# Patient Record
Sex: Female | Born: 1970 | Race: Black or African American | Hispanic: No | Marital: Single | Smoking: Current every day smoker
Health system: Southern US, Community
[De-identification: ages and names within clinical notes are randomized; demographics above are authoritative.]

## PROBLEM LIST (undated history)

## (undated) DIAGNOSIS — F419 Anxiety disorder, unspecified: Secondary | ICD-10-CM

## (undated) DIAGNOSIS — D849 Immunodeficiency, unspecified: Secondary | ICD-10-CM

## (undated) DIAGNOSIS — K219 Gastro-esophageal reflux disease without esophagitis: Secondary | ICD-10-CM

## (undated) DIAGNOSIS — N946 Dysmenorrhea, unspecified: Secondary | ICD-10-CM

## (undated) DIAGNOSIS — L409 Psoriasis, unspecified: Secondary | ICD-10-CM

## (undated) DIAGNOSIS — K2970 Gastritis, unspecified, without bleeding: Secondary | ICD-10-CM

## (undated) DIAGNOSIS — E785 Hyperlipidemia, unspecified: Secondary | ICD-10-CM

## (undated) DIAGNOSIS — D219 Benign neoplasm of connective and other soft tissue, unspecified: Secondary | ICD-10-CM

## (undated) DIAGNOSIS — R32 Unspecified urinary incontinence: Secondary | ICD-10-CM

## (undated) HISTORY — DX: Benign neoplasm of connective and other soft tissue, unspecified: D21.9

## (undated) HISTORY — DX: Hyperlipidemia, unspecified: E78.5

## (undated) HISTORY — DX: Immunodeficiency, unspecified: D84.9

## (undated) HISTORY — DX: Gastritis, unspecified, without bleeding: K29.70

## (undated) HISTORY — DX: Unspecified urinary incontinence: R32

## (undated) HISTORY — DX: Dysmenorrhea, unspecified: N94.6

## (undated) HISTORY — DX: Psoriasis, unspecified: L40.9

## (undated) HISTORY — PX: TUBAL LIGATION: SHX77

---

## 2003-12-10 ENCOUNTER — Emergency Department: Payer: Self-pay | Admitting: Emergency Medicine

## 2004-09-17 ENCOUNTER — Emergency Department: Payer: Self-pay | Admitting: General Practice

## 2005-05-04 ENCOUNTER — Emergency Department: Payer: Self-pay | Admitting: General Practice

## 2006-08-18 ENCOUNTER — Emergency Department: Payer: Self-pay | Admitting: General Practice

## 2006-10-04 ENCOUNTER — Emergency Department: Payer: Self-pay | Admitting: Emergency Medicine

## 2007-07-16 ENCOUNTER — Emergency Department: Payer: Self-pay | Admitting: Internal Medicine

## 2007-09-06 ENCOUNTER — Emergency Department: Payer: Self-pay | Admitting: Emergency Medicine

## 2008-08-23 ENCOUNTER — Emergency Department: Payer: Self-pay | Admitting: Emergency Medicine

## 2009-10-27 ENCOUNTER — Emergency Department: Payer: Self-pay | Admitting: Emergency Medicine

## 2009-10-29 ENCOUNTER — Emergency Department (HOSPITAL_COMMUNITY): Admission: EM | Admit: 2009-10-29 | Discharge: 2009-10-29 | Payer: Self-pay | Admitting: Emergency Medicine

## 2009-12-11 ENCOUNTER — Emergency Department: Payer: Self-pay | Admitting: Emergency Medicine

## 2010-02-19 ENCOUNTER — Emergency Department: Payer: Self-pay | Admitting: Emergency Medicine

## 2010-03-10 ENCOUNTER — Emergency Department (HOSPITAL_COMMUNITY)
Admission: EM | Admit: 2010-03-10 | Discharge: 2010-03-10 | Payer: Self-pay | Source: Home / Self Care | Admitting: Emergency Medicine

## 2010-04-11 ENCOUNTER — Emergency Department: Payer: Self-pay | Admitting: Emergency Medicine

## 2010-05-08 LAB — CBC
HCT: 41.4 % (ref 36.0–46.0)
Hemoglobin: 14 g/dL (ref 12.0–15.0)
RBC: 4.65 MIL/uL (ref 3.87–5.11)
WBC: 10.5 10*3/uL (ref 4.0–10.5)

## 2010-05-08 LAB — COMPREHENSIVE METABOLIC PANEL
ALT: 12 U/L (ref 0–35)
AST: 18 U/L (ref 0–37)
Alkaline Phosphatase: 75 U/L (ref 39–117)
CO2: 25 mEq/L (ref 19–32)
GFR calc non Af Amer: >60 mL/min (ref >60)
Glucose, Bld: 93 mg/dL (ref 70–99)
Potassium: 3.5 mEq/L (ref 3.5–5.1)
Sodium: 138 mEq/L (ref 135–145)

## 2010-05-08 LAB — URINALYSIS, ROUTINE W REFLEX MICROSCOPIC
Bilirubin Urine: NEGATIVE
Hgb urine dipstick: NEGATIVE
Ketones, ur: 15 mg/dL — AB
Protein, ur: NEGATIVE mg/dL
Urobilinogen, UA: 0.2 mg/dL (ref 0.0–1.0)

## 2010-05-08 LAB — LIPASE, BLOOD: Lipase: 17 U/L (ref 11–59)

## 2010-05-08 LAB — DIFFERENTIAL
Basophils Relative: 0 % (ref 0–1)
Eosinophils Absolute: 0 10*3/uL (ref 0.0–0.7)
Eosinophils Relative: 0 % (ref 0–5)
Monocytes Relative: 8 % (ref 3–12)
Neutrophils Relative %: 69 % (ref 43–77)

## 2010-05-08 LAB — POCT PREGNANCY, URINE: Preg Test, Ur: NEGATIVE

## 2010-05-08 LAB — URINE MICROSCOPIC-ADD ON

## 2010-09-09 ENCOUNTER — Emergency Department: Payer: Self-pay | Admitting: Emergency Medicine

## 2011-12-16 ENCOUNTER — Emergency Department: Payer: Self-pay | Admitting: Emergency Medicine

## 2011-12-16 LAB — URINALYSIS, COMPLETE
Bacteria: NONE SEEN
Bilirubin,UR: NEGATIVE
Glucose,UR: NEGATIVE mg/dL (ref 0–75)
Ketone: NEGATIVE
Specific Gravity: 1.001 (ref 1.003–1.030)
Squamous Epithelial: 6
WBC UR: 3 /HPF (ref 0–5)

## 2011-12-16 LAB — CBC
HGB: 14.7 g/dL (ref 12.0–16.0)
MCH: 31.3 pg (ref 26.0–34.0)
Platelet: 209 10*3/uL (ref 150–440)
RBC: 4.7 10*6/uL (ref 3.80–5.20)
WBC: 9.8 10*3/uL (ref 3.6–11.0)

## 2011-12-16 LAB — BASIC METABOLIC PANEL
Creatinine: 0.53 mg/dL — ABNORMAL LOW (ref 0.60–1.30)
EGFR (African American): >60
EGFR (Non-African Amer.): >60
Glucose: 84 mg/dL (ref 65–99)
Sodium: 140 mmol/L (ref 136–145)

## 2011-12-17 LAB — WET PREP, GENITAL

## 2012-02-15 ENCOUNTER — Emergency Department: Payer: Self-pay | Admitting: Emergency Medicine

## 2012-08-22 ENCOUNTER — Other Ambulatory Visit: Payer: Self-pay | Admitting: Orthopedic Surgery

## 2012-08-22 DIAGNOSIS — M79645 Pain in left finger(s): Secondary | ICD-10-CM

## 2012-08-22 DIAGNOSIS — M25532 Pain in left wrist: Secondary | ICD-10-CM

## 2012-08-28 ENCOUNTER — Ambulatory Visit
Admission: RE | Admit: 2012-08-28 | Discharge: 2012-08-28 | Disposition: A | Discharge status: Home / Self Care | Payer: BC Managed Care – PPO | Source: Ambulatory Visit | Attending: Orthopedic Surgery | Admitting: Orthopedic Surgery

## 2012-08-28 DIAGNOSIS — M79645 Pain in left finger(s): Secondary | ICD-10-CM

## 2012-08-28 DIAGNOSIS — M25532 Pain in left wrist: Secondary | ICD-10-CM

## 2013-04-24 ENCOUNTER — Emergency Department: Payer: Self-pay | Admitting: Emergency Medicine

## 2013-04-24 LAB — URINALYSIS, COMPLETE
Bilirubin,UR: NEGATIVE
GLUCOSE, UR: NEGATIVE mg/dL (ref 0–75)
KETONE: NEGATIVE
NITRITE: NEGATIVE
PROTEIN: NEGATIVE
Ph: 5 (ref 4.5–8.0)
RBC,UR: 15 /HPF (ref 0–5)
SPECIFIC GRAVITY: 1.011 (ref 1.003–1.030)

## 2013-04-24 LAB — WET PREP, GENITAL

## 2013-04-24 LAB — GC/CHLAMYDIA PROBE AMP

## 2014-01-02 ENCOUNTER — Emergency Department: Payer: Self-pay | Admitting: Emergency Medicine

## 2014-01-02 LAB — COMPREHENSIVE METABOLIC PANEL
ALT: 34 U/L
ANION GAP: 6 — AB (ref 7–16)
AST: 23 U/L (ref 15–37)
Albumin: 3.5 g/dL (ref 3.4–5.0)
Alkaline Phosphatase: 88 U/L
BUN: 11 mg/dL (ref 7–18)
Bilirubin,Total: 0.2 mg/dL (ref 0.2–1.0)
CALCIUM: 8.4 mg/dL — AB (ref 8.5–10.1)
CHLORIDE: 107 mmol/L (ref 98–107)
CREATININE: 0.57 mg/dL — AB (ref 0.60–1.30)
Co2: 28 mmol/L (ref 21–32)
EGFR (African American): >60
GLUCOSE: 88 mg/dL (ref 65–99)
OSMOLALITY: 280 (ref 275–301)
POTASSIUM: 4.2 mmol/L (ref 3.5–5.1)
SODIUM: 141 mmol/L (ref 136–145)
TOTAL PROTEIN: 6.8 g/dL (ref 6.4–8.2)

## 2014-01-02 LAB — URINALYSIS, COMPLETE
BACTERIA: NONE SEEN
BILIRUBIN, UR: NEGATIVE
Glucose,UR: NEGATIVE mg/dL (ref 0–75)
Ketone: NEGATIVE
Leukocyte Esterase: NEGATIVE
NITRITE: NEGATIVE
PH: 6 (ref 4.5–8.0)
Protein: NEGATIVE
SPECIFIC GRAVITY: 1.002 (ref 1.003–1.030)
WBC UR: NONE SEEN /HPF (ref 0–5)

## 2014-01-02 LAB — CBC
HCT: 40.1 % (ref 35.0–47.0)
HGB: 13.2 g/dL (ref 12.0–16.0)
MCH: 30.1 pg (ref 26.0–34.0)
MCHC: 33 g/dL (ref 32.0–36.0)
MCV: 91 fL (ref 80–100)
Platelet: 250 10*3/uL (ref 150–440)
RBC: 4.39 10*6/uL (ref 3.80–5.20)
RDW: 14.5 % (ref 11.5–14.5)
WBC: 10 10*3/uL (ref 3.6–11.0)

## 2014-06-02 ENCOUNTER — Emergency Department
Admit: 2014-06-02 | Disposition: A | Discharge status: Home / Self Care | Payer: Self-pay | Admitting: Physician Assistant

## 2015-03-09 ENCOUNTER — Encounter (HOSPITAL_BASED_OUTPATIENT_CLINIC_OR_DEPARTMENT_OTHER): Payer: Self-pay

## 2015-03-09 ENCOUNTER — Emergency Department (HOSPITAL_BASED_OUTPATIENT_CLINIC_OR_DEPARTMENT_OTHER)
Admission: EM | Admit: 2015-03-09 | Discharge: 2015-03-09 | Disposition: A | Discharge status: Home / Self Care | Payer: BLUE CROSS/BLUE SHIELD | Attending: Emergency Medicine | Admitting: Emergency Medicine

## 2015-03-09 DIAGNOSIS — Z3202 Encounter for pregnancy test, result negative: Secondary | ICD-10-CM | POA: Diagnosis not present

## 2015-03-09 DIAGNOSIS — F17210 Nicotine dependence, cigarettes, uncomplicated: Secondary | ICD-10-CM | POA: Diagnosis not present

## 2015-03-09 DIAGNOSIS — R1010 Upper abdominal pain, unspecified: Secondary | ICD-10-CM

## 2015-03-09 DIAGNOSIS — Z880 Allergy status to penicillin: Secondary | ICD-10-CM | POA: Insufficient documentation

## 2015-03-09 DIAGNOSIS — R197 Diarrhea, unspecified: Secondary | ICD-10-CM | POA: Diagnosis not present

## 2015-03-09 DIAGNOSIS — M542 Cervicalgia: Secondary | ICD-10-CM | POA: Insufficient documentation

## 2015-03-09 DIAGNOSIS — M549 Dorsalgia, unspecified: Secondary | ICD-10-CM | POA: Diagnosis present

## 2015-03-09 DIAGNOSIS — R1013 Epigastric pain: Secondary | ICD-10-CM | POA: Diagnosis not present

## 2015-03-09 DIAGNOSIS — R1011 Right upper quadrant pain: Secondary | ICD-10-CM | POA: Diagnosis not present

## 2015-03-09 DIAGNOSIS — Z8719 Personal history of other diseases of the digestive system: Secondary | ICD-10-CM | POA: Insufficient documentation

## 2015-03-09 DIAGNOSIS — R1012 Left upper quadrant pain: Secondary | ICD-10-CM | POA: Insufficient documentation

## 2015-03-09 HISTORY — DX: Gastro-esophageal reflux disease without esophagitis: K21.9

## 2015-03-09 LAB — COMPREHENSIVE METABOLIC PANEL
ALT: 12 U/L — ABNORMAL LOW (ref 14–54)
ANION GAP: 8 (ref 5–15)
AST: 17 U/L (ref 15–41)
Albumin: 3.8 g/dL (ref 3.5–5.0)
Alkaline Phosphatase: 52 U/L (ref 38–126)
BUN: 7 mg/dL (ref 6–20)
CHLORIDE: 106 mmol/L (ref 101–111)
CO2: 27 mmol/L (ref 22–32)
Calcium: 9.3 mg/dL (ref 8.9–10.3)
Creatinine, Ser: 0.6 mg/dL (ref 0.44–1.00)
GFR calc non Af Amer: >60 mL/min (ref >60)
Glucose, Bld: 89 mg/dL (ref 65–99)
POTASSIUM: 3.7 mmol/L (ref 3.5–5.1)
SODIUM: 141 mmol/L (ref 135–145)
Total Bilirubin: 0.5 mg/dL (ref 0.3–1.2)
Total Protein: 6.4 g/dL — ABNORMAL LOW (ref 6.5–8.1)

## 2015-03-09 LAB — CBC WITH DIFFERENTIAL/PLATELET
Basophils Absolute: 0 10*3/uL (ref 0.0–0.1)
Basophils Relative: 0 %
Eosinophils Absolute: 0.1 10*3/uL (ref 0.0–0.7)
Eosinophils Relative: 1 %
HEMATOCRIT: 37.9 % (ref 36.0–46.0)
Hemoglobin: 12.3 g/dL (ref 12.0–15.0)
LYMPHS ABS: 1.2 10*3/uL (ref 0.7–4.0)
LYMPHS PCT: 17 %
MCH: 28.8 pg (ref 26.0–34.0)
MCHC: 32.5 g/dL (ref 30.0–36.0)
MCV: 88.8 fL (ref 78.0–100.0)
Monocytes Absolute: 0.6 10*3/uL (ref 0.1–1.0)
Monocytes Relative: 9 %
NEUTROS PCT: 73 %
Neutro Abs: 5.1 10*3/uL (ref 1.7–7.7)
PLATELETS: 269 10*3/uL (ref 150–400)
RBC: 4.27 MIL/uL (ref 3.87–5.11)
RDW: 14.6 % (ref 11.5–15.5)
WBC: 7 10*3/uL (ref 4.0–10.5)

## 2015-03-09 LAB — LIPASE, BLOOD: Lipase: 17 U/L (ref 11–51)

## 2015-03-09 LAB — PREGNANCY, URINE: Preg Test, Ur: NEGATIVE

## 2015-03-09 MED ORDER — PANTOPRAZOLE SODIUM 40 MG PO TBEC: 40.0000 mg | DELAYED_RELEASE_TABLET | Freq: Every day | ORAL | Status: DC

## 2015-03-09 MED ORDER — GI COCKTAIL ~~LOC~~
30.0000 mL | Freq: Once | ORAL | Status: AC
  Administered 2015-03-09: 30 mL via ORAL
  Filled 2015-03-09: qty 30

## 2015-03-09 NOTE — ED Notes (Signed)
Patient here with multiple complaints of feeling full, not able to pass gas and reports constipation had small BM this am. Patient states that she thinks all the gas is moving around in her back

## 2015-03-09 NOTE — ED Provider Notes (Signed)
CSN: [id number]     Arrival date & time 03/09/15  [id number] History   First MD Initiated Contact with Patient 03/09/15 1025     Chief Complaint  Patient presents with  . Neck Pain  . Back Pain     (Consider location/radiation/quality/duration/timing/severity/associated sxs/prior Treatment) HPI  45 year old female presents with "my gastritis flaring". She states she's been having intermittent pain, mostly in her lateral neck and back for the past 1 week. She is burping frequently and when she burps the pain seems to get better but then it comes back. Denies abdominal pain or chest pain. No shortness of breath. Patient denies a weakness or numbness in her extremities. She states that she has a history of GERD and history on Pepcid and other medicines without any relief. Was told by her PCP about one week ago that all blood work she is a prediabetic and was told to cut out sugar and exercise more. She thinks changing her diet recently has caused a lot of the symptoms. She had diarrhea couple days ago but since has been having bowel movements that are more normal.   Past Medical History  Diagnosis Date  . GERD (gastroesophageal reflux disease)    History reviewed. No pertinent past surgical history. No family history on file. Social History  Substance Use Topics  . Smoking status: Current Every Day Smoker    Types: Cigarettes  . Smokeless tobacco: None  . Alcohol Use: None   OB History    No data available     Review of Systems  Respiratory: Negative for shortness of breath.   Cardiovascular: Negative for chest pain.  Gastrointestinal: Positive for diarrhea. Negative for vomiting and abdominal pain.  Genitourinary: Negative for dysuria.  Musculoskeletal: Positive for back pain and neck pain.  All other systems reviewed and are negative.     Allergies  Penicillins  Home Medications   Prior to Admission medications   Not on File   BP 124/83 mmHg  Pulse 80  Temp(Src) 98.8 F  (37.1 C) (Oral)  Resp 14  Ht 5' (1.524 m)  Wt 160 lb (72.576 kg)  BMI 31.25 kg/m2  SpO2 98% Physical Exam  Constitutional: She is oriented to person, place, and time. She appears well-developed and well-nourished.  HENT:  Head: Normocephalic and atraumatic.  Right Ear: External ear normal.  Left Ear: External ear normal.  Nose: Nose normal.  Eyes: Right eye exhibits no discharge. Left eye exhibits no discharge.  Cardiovascular: Normal rate, regular rhythm and normal heart sounds.   Pulmonary/Chest: Effort normal and breath sounds normal.  Abdominal: Soft. There is tenderness in the right upper quadrant, epigastric area and left upper quadrant.  Musculoskeletal:  No neck or back midline or soft tissue tenderness  Neurological: She is alert and oriented to person, place, and time.  Skin: Skin is warm and dry.  Nursing note and vitals reviewed.   ED Course  Procedures (including critical care time) Labs Review Labs Reviewed  COMPREHENSIVE METABOLIC PANEL - Abnormal; Notable for the following:    Total Protein 6.4 (*)    ALT 12 (*)    All other components within normal limits  LIPASE, BLOOD  CBC WITH DIFFERENTIAL/PLATELET  PREGNANCY, URINE    Imaging Review No results found. I have personally reviewed and evaluated these images and lab results as part of my medical decision-making.   EKG Interpretation None      MDM   Final diagnoses:  Upper abdominal pain  Patient with multiple moving pain complaints. She should persist to her past acid reflux. The only finding on exam is some mild upper abdominal tenderness. Labwork unremarkable and I have low suspicion for gallbladder or pancreas pathology. Most likely is GI in origin, will start on Protonix and follow-up with PCP.    Sherwood Gambler, MD 03/10/15 [phone]

## 2015-03-20 ENCOUNTER — Encounter (HOSPITAL_BASED_OUTPATIENT_CLINIC_OR_DEPARTMENT_OTHER): Payer: Self-pay | Admitting: Emergency Medicine

## 2015-03-20 ENCOUNTER — Emergency Department (HOSPITAL_BASED_OUTPATIENT_CLINIC_OR_DEPARTMENT_OTHER)
Admission: EM | Admit: 2015-03-20 | Discharge: 2015-03-20 | Disposition: A | Discharge status: Home / Self Care | Payer: BLUE CROSS/BLUE SHIELD | Attending: Emergency Medicine | Admitting: Emergency Medicine

## 2015-03-20 DIAGNOSIS — Z0000 Encounter for general adult medical examination without abnormal findings: Secondary | ICD-10-CM | POA: Diagnosis not present

## 2015-03-20 DIAGNOSIS — Y9389 Activity, other specified: Secondary | ICD-10-CM | POA: Insufficient documentation

## 2015-03-20 DIAGNOSIS — Z880 Allergy status to penicillin: Secondary | ICD-10-CM | POA: Insufficient documentation

## 2015-03-20 DIAGNOSIS — K219 Gastro-esophageal reflux disease without esophagitis: Secondary | ICD-10-CM | POA: Diagnosis not present

## 2015-03-20 DIAGNOSIS — F17210 Nicotine dependence, cigarettes, uncomplicated: Secondary | ICD-10-CM | POA: Diagnosis not present

## 2015-03-20 DIAGNOSIS — Z79899 Other long term (current) drug therapy: Secondary | ICD-10-CM | POA: Insufficient documentation

## 2015-03-20 DIAGNOSIS — Z23 Encounter for immunization: Secondary | ICD-10-CM | POA: Diagnosis not present

## 2015-03-20 DIAGNOSIS — Y998 Other external cause status: Secondary | ICD-10-CM | POA: Insufficient documentation

## 2015-03-20 DIAGNOSIS — W25XXXA Contact with sharp glass, initial encounter: Secondary | ICD-10-CM | POA: Diagnosis not present

## 2015-03-20 DIAGNOSIS — Y9289 Other specified places as the place of occurrence of the external cause: Secondary | ICD-10-CM | POA: Diagnosis not present

## 2015-03-20 DIAGNOSIS — S01512A Laceration without foreign body of oral cavity, initial encounter: Secondary | ICD-10-CM | POA: Diagnosis present

## 2015-03-20 MED ORDER — TETANUS-DIPHTH-ACELL PERTUSSIS 5-2.5-18.5 LF-MCG/0.5 IM SUSP
0.5000 mL | Freq: Once | INTRAMUSCULAR | Status: AC
  Administered 2015-03-20: 0.5 mL via INTRAMUSCULAR
  Filled 2015-03-20: qty 0.5

## 2015-03-20 NOTE — ED Notes (Signed)
Pt sts she bit into a biscuit at lunch and there was a piece of glass in the biscuit.  Cut her tongue.  No active bleeding at this time.  Sts she is unsure of her last tetanus.

## 2015-03-20 NOTE — ED Provider Notes (Signed)
CSN: [id number]     Arrival date & time 03/20/15  1741 History   First MD Initiated Contact with Patient 03/20/15 1934     Chief Complaint  Patient presents with  . tongue laceration      (Consider location/radiation/quality/duration/timing/severity/associated sxs/prior Treatment) HPI  Blood pressure 128/76, pulse 85, temperature 98.9 F (37.2 C), temperature source Oral, resp. rate 16, height 5' (1.524 m), weight 72.576 kg, last menstrual period 02/20/2015, SpO2 100 %.  Ruth Kennedy is a 45 y.o. female complaining of laceration to right side of tongue which she sustained this afternoon. Patient states that she was eating leftovers from a restaurant which she went to last night and she felt like there was a piece of glass in the biscuit. States that she could taste blood initially. She was encouraged to present to the ED by her primary care physician. She denies any pain in the throat, cough, nausea, vomiting. Last tetanus shot is unknown.  Past Medical History  Diagnosis Date  . GERD (gastroesophageal reflux disease)    Past Surgical History  Procedure Laterality Date  . Tubal ligation     No family history on file. Social History  Substance Use Topics  . Smoking status: Current Every Day Smoker -- 0.50 packs/day    Types: Cigarettes  . Smokeless tobacco: None  . Alcohol Use: Yes     Comment: weekly   OB History    No data available     Review of Systems  10 systems reviewed and found to be negative, except as noted in the HPI.   Allergies  Protonix and Penicillins  Home Medications   Prior to Admission medications   Medication Sig Start Date End Date Taking? Authorizing Provider  famotidine (PEPCID AC) 10 MG chewable tablet Chew 10 mg by mouth 2 (two) times daily.   Yes Historical Provider, MD  simethicone (MYLICON) 0000000 MG chewable tablet Chew 125 mg by mouth every 6 (six) hours as needed for flatulence.   Yes Historical Provider, MD  pantoprazole (PROTONIX)  40 MG tablet Take 1 tablet (40 mg total) by mouth daily. 03/09/15   Sherwood Gambler, MD   BP 128/76 mmHg  Pulse 85  Temp(Src) 98.9 F (37.2 C) (Oral)  Resp 16  Ht 5' (1.524 m)  Wt 72.576 kg  BMI 31.25 kg/m2  SpO2 100%  LMP 02/20/2015 (Approximate) Physical Exam  Constitutional: She is oriented to person, place, and time. She appears well-developed and well-nourished. No distress.  HENT:  Head: Normocephalic and atraumatic.  Mouth/Throat: Oropharynx is clear and moist.  Patient points to the far lateral right side of her tongue where she states the laceration occurred. States she can still feel it however, no observable bleeding or punctures.  Eyes: Conjunctivae and EOM are normal. Pupils are equal, round, and reactive to light.  Neck: Normal range of motion.  Cardiovascular: Normal rate, regular rhythm and intact distal pulses.   Pulmonary/Chest: Effort normal and breath sounds normal. No stridor.  Abdominal: Soft. There is no tenderness.  Musculoskeletal: Normal range of motion.  Neurological: She is alert and oriented to person, place, and time.  Skin: She is not diaphoretic.  Psychiatric: She has a normal mood and affect.  Nursing note and vitals reviewed.   ED Course  Procedures (including critical care time) Labs Review Labs Reviewed - No data to display  Imaging Review No results found. I have personally reviewed and evaluated these images and lab results as part of my medical decision-making.  EKG Interpretation None      MDM   Final diagnoses:  Normal physical exam    Filed Vitals:   03/20/15 1754  BP: 128/76  Pulse: 85  Temp: 98.9 F (37.2 C)  TempSrc: Oral  Resp: 16  Height: 5' (1.524 m)  Weight: 72.576 kg  SpO2: 100%    Medications  Tdap (BOOSTRIX) injection 0.5 mL (not administered)    Ruth Kennedy is 45 y.o. female presenting with sensation of tongue laceration after she was eating earlier in the day. Physical exam with no  abnormalities, she may have had a small cut to the tongue which has healed over. Patient's tetanus is updated  Evaluation does not show pathology that would require ongoing emergent intervention or inpatient treatment. Pt is hemodynamically stable and mentating appropriately. Discussed findings and plan with patient/guardian, who agrees with care plan. All questions answered. Return precautions discussed and outpatient follow up given.     Monico Blitz, PA-C 03/20/15 [place], DO 03/20/15 2358

## 2015-03-20 NOTE — Discharge Instructions (Signed)
Please follow with your primary care doctor in the next 2 days for a check-up. They must obtain records for further management.   Do not hesitate to return to the Emergency Department for any new, worsening or concerning symptoms.    Mouth Laceration A mouth laceration is a deep cut inside your mouth. The cut may go into your lip or go all of the way through your mouth and cheek. The cut may involve your tongue, the insides of your check, or the upper surface of your mouth (palate). Mouth lacerations may bleed a lot and may need to be treated with stitches (sutures). HOME CARE  Take medicines only as told by your doctor.  If you were prescribed an antibiotic medicine, finish all of it even if you start to feel better.  Eat as told by your doctor. You may only be able to eat drink liquids or eat soft foods for a few days.  Rinse your mouth with a warm, salt-water rinse 4-6 times per day or as told by your doctor. You can make a salt-water rinse by mixing one tsp of salt into two cups of warm water.  Do not poke the sutures with your tongue. Doing that can loosen them.  Check your wound every day for signs of infection. It is normal to have a white or gray patch over your wound while it heals. Watch for:  Redness.  Puffiness (swelling).  Blood or pus.  Keep your mouth and teeth clean (oral hygiene) like you normally do, if possible. Gently brush your teeth with a soft, nylon-bristled toothbrush 2 times per day.  Keep all follow-up visits as told by your doctor. This is important. GET HELP IF:  You got a tetanus shot and you have swelling, really bad pain, redness, or bleeding at the injection site.  You have a fever.  Medicine does not help your pain.  You have redness, swelling, or pain at your wound that is getting worse.  You have fresh bleeding or pus coming from your wound.  The edges of your wound break open.  Your neck or throat is puffy or tender. GET HELP RIGHT  AWAY IF:  You have swelling in your face or the area under your jaw.  You have trouble breathing or swallowing.   This information is not intended to replace advice given to you by your health care provider. Make sure you discuss any questions you have with your health care provider.   Document Released: 07/29/2007 Document Revised: 06/26/2014 Document Reviewed: 01/31/2014 Elsevier Interactive Patient Education [employer].

## 2016-03-16 ENCOUNTER — Emergency Department (HOSPITAL_BASED_OUTPATIENT_CLINIC_OR_DEPARTMENT_OTHER)
Admission: EM | Admit: 2016-03-16 | Discharge: 2016-03-16 | Disposition: A | Discharge status: Home / Self Care | Payer: BLUE CROSS/BLUE SHIELD | Attending: Emergency Medicine | Admitting: Emergency Medicine

## 2016-03-16 ENCOUNTER — Encounter (HOSPITAL_BASED_OUTPATIENT_CLINIC_OR_DEPARTMENT_OTHER): Payer: Self-pay

## 2016-03-16 DIAGNOSIS — Z79899 Other long term (current) drug therapy: Secondary | ICD-10-CM | POA: Insufficient documentation

## 2016-03-16 DIAGNOSIS — N390 Urinary tract infection, site not specified: Secondary | ICD-10-CM | POA: Diagnosis not present

## 2016-03-16 DIAGNOSIS — F17210 Nicotine dependence, cigarettes, uncomplicated: Secondary | ICD-10-CM | POA: Insufficient documentation

## 2016-03-16 DIAGNOSIS — M545 Low back pain: Secondary | ICD-10-CM | POA: Diagnosis present

## 2016-03-16 LAB — WET PREP, GENITAL
Sperm: NONE SEEN
TRICH WET PREP: NONE SEEN
Yeast Wet Prep HPF POC: NONE SEEN

## 2016-03-16 LAB — URINALYSIS, ROUTINE W REFLEX MICROSCOPIC
Bilirubin Urine: NEGATIVE
GLUCOSE, UA: NEGATIVE mg/dL
Ketones, ur: NEGATIVE mg/dL
NITRITE: NEGATIVE
PH: 6 (ref 5.0–8.0)
PROTEIN: NEGATIVE mg/dL
Specific Gravity, Urine: 1.01 (ref 1.005–1.030)

## 2016-03-16 LAB — URINALYSIS, MICROSCOPIC (REFLEX)

## 2016-03-16 LAB — PREGNANCY, URINE: PREG TEST UR: NEGATIVE

## 2016-03-16 MED ORDER — DOXYCYCLINE HYCLATE 100 MG PO CAPS: 100.0000 mg | ORAL_CAPSULE | Freq: Two times a day (BID) | ORAL | 0 refills | Status: DC

## 2016-03-16 MED ORDER — CEFTRIAXONE SODIUM 250 MG IJ SOLR
250.0000 mg | Freq: Once | INTRAMUSCULAR | Status: AC
  Administered 2016-03-16: 250 mg via INTRAMUSCULAR
  Filled 2016-03-16: qty 250

## 2016-03-16 MED ORDER — DOXYCYCLINE HYCLATE 100 MG PO TABS
100.0000 mg | ORAL_TABLET | Freq: Once | ORAL | Status: AC
  Administered 2016-03-16: 100 mg via ORAL
  Filled 2016-03-16: qty 1

## 2016-03-16 NOTE — ED Provider Notes (Signed)
[facility] Provider Note   CSN: [id number] Arrival date & time: 03/16/16  1703  By signing my name below, I, Ephriam Jenkins, attest that this documentation has been prepared under the direction and in the presence of Blanchie Dessert, MD. Electronically signed, Ephriam Jenkins, ED Scribe. 03/16/16. 8:28 PM.  History   Chief Complaint Chief Complaint  Patient presents with  . Abdominal Pain    HPI HPI Comments: Ruth Kennedy is a 46 y.o. female, with Hx of tubal ligation, who presents to the Emergency Department complaining of bilateral lower back pain that radiates circumferentially to her suprapubic abdomen, onset 3 days ago. She also reports increased urinary frequency and vaginal discharge. Her LNMP was at the end of last month. She is currently sexually active with one partner, he is not experiencing similar symptoms as far as she knows. Pt states "it feels like someone is kicking me in my back". She also notes some mild constipation that has persisted since the onset of her symptoms. No known injury. She works a Designer, multimedia. No nausea, vomiting or diarrhea.  The history is provided by the patient. No language interpreter was used.    Past Medical History:  Diagnosis Date  . GERD (gastroesophageal reflux disease)     There are no active problems to display for this patient.   Past Surgical History:  Procedure Laterality Date  . TUBAL LIGATION      OB History    No data available     Home Medications    Prior to Admission medications   Medication Sig Start Date End Date Taking? Authorizing Provider  Escitalopram Oxalate (LEXAPRO PO) Take by mouth.   Yes Historical Provider, MD  famotidine (PEPCID AC) 10 MG chewable tablet Chew 10 mg by mouth 2 (two) times daily.   Yes Historical Provider, MD  METHOCARBAMOL PO Take by mouth.   Yes Historical Provider, MD   Family History No family history on file.  Social History Social History  Substance Use Topics  .  Smoking status: Current Every Day Smoker    Packs/day: 0.50    Types: Cigarettes  . Smokeless tobacco: Never Used  . Alcohol use Yes   Allergies   Protonix [pantoprazole sodium] and Penicillins   Review of Systems Review of Systems  Gastrointestinal: Positive for abdominal pain (lower). Negative for diarrhea, nausea and vomiting.  Genitourinary: Positive for urgency.  Musculoskeletal: Positive for back pain (bilateral, lower).  All other systems reviewed and are negative.   Physical Exam Updated Vital Signs BP 131/74 (BP Location: Right Arm)   Pulse 85   Temp 98.6 F (37 C) (Oral)   Resp 18   Ht 5' (1.524 m)   Wt 176 lb (79.8 kg)   LMP 02/18/2016   SpO2 100%   BMI 34.37 kg/m   Physical Exam  Constitutional: She is oriented to person, place, and time. She appears well-developed and well-nourished.  HENT:  Head: Normocephalic and atraumatic.  Eyes: Conjunctivae are normal.  Neck: Neck supple.  Cardiovascular: Normal rate and regular rhythm.   Pulmonary/Chest: Effort normal and breath sounds normal.  Abdominal: Soft. Bowel sounds are normal. There is tenderness.  Mild suprapubic tenderness.   Genitourinary:  Genitourinary Comments: Mild cervical motion tenderness. Small amount of thin yellow discharge.  Musculoskeletal: Normal range of motion.  Mild lumbar tenderness. No flank tenderness.    Neurological: She is alert and oriented to person, place, and time.  Skin: Skin is warm and dry.  Psychiatric:  She has a normal mood and affect. Her behavior is normal.  Nursing note and vitals reviewed.   ED Treatments / Results  DIAGNOSTIC STUDIES: Oxygen Saturation is 100% on RA, normal by my interpretation.  COORDINATION OF CARE: 8:25 PM-Discussed treatment plan with pt at bedside and pt agreed to plan.   Labs (all labs ordered are listed, but only abnormal results are displayed) Labs Reviewed  WET PREP, GENITAL - Abnormal; Notable for the following:       Result  Value   Clue Cells Wet Prep HPF POC PRESENT (*)    WBC, Wet Prep HPF POC MANY (*)    All other components within normal limits  URINALYSIS, ROUTINE W REFLEX MICROSCOPIC - Abnormal; Notable for the following:    APPearance CLOUDY (*)    Hgb urine dipstick TRACE (*)    Leukocytes, UA LARGE (*)    All other components within normal limits  URINALYSIS, MICROSCOPIC (REFLEX) - Abnormal; Notable for the following:    Bacteria, UA FEW (*)    Squamous Epithelial / LPF 6-30 (*)    All other components within normal limits  PREGNANCY, URINE  GC/CHLAMYDIA PROBE AMP ([place]) NOT AT [facility]    EKG  EKG Interpretation None       Radiology No results found.  Procedures Procedures (including critical care time)  Medications Ordered in ED Medications  cefTRIAXone (ROCEPHIN) injection 250 mg (250 mg Intramuscular Given 03/16/16 2146)  doxycycline (VIBRA-TABS) tablet 100 mg (100 mg Oral Given 03/16/16 2146)     Initial Impression / Assessment and Plan / ED Course  I have reviewed the triage vital signs and the nursing notes.  Pertinent labs & imaging results that were available during my care of the patient were reviewed by me and considered in my medical decision making (see chart for details).    Patient is a 46 year old female presenting today with one week of worsening suprapubic and lumbar pain. She has had some mild vaginal discharge which is new but denies any new partners. Patient has had urinary frequency but denies any dysuria. On exam patient has suprapubic tenderness. Minimal vaginal discharge and minimal cervical motion tenderness but no chandelier sign and no localized ovarian tenderness concerning for torsion or tubo-ovarian abscess. Low suspicion for kidney stone. UA with large leukocytes and 6-30 white blood cells. However some contamination. Wet prep with many white blood cells and clue cells present. Will treat with Rocephin and doxycycline and cover for a UTI as well as  pelvic pathology. Patient given return precautions.  Final Clinical Impressions(s) / ED Diagnoses   Final diagnoses:  Urinary tract infection without hematuria, site unspecified    New Prescriptions Discharge Medication List as of 03/16/2016 10:07 PM    START taking these medications   Details  doxycycline (VIBRAMYCIN) 100 MG capsule Take 1 capsule (100 mg total) by mouth 2 (two) times daily., Starting Mon 03/16/2016, Print       I personally performed the services described in this documentation, which was scribed in my presence.  The recorded information has been reviewed and considered.     Blanchie Dessert, MD 03/16/16 [phone]

## 2016-03-16 NOTE — ED Triage Notes (Addendum)
C/o lower abd/back, left flank pain x 3 days-denies n/v/d-NAD-steady gait

## 2016-03-16 NOTE — ED Notes (Signed)
Pt verbalizes understanding of d/c instructions and denies any further needs at this time. 

## 2016-03-17 LAB — GC/CHLAMYDIA PROBE AMP (~~LOC~~) NOT AT ARMC
Chlamydia: NEGATIVE
Neisseria Gonorrhea: NEGATIVE

## 2016-08-09 ENCOUNTER — Emergency Department (HOSPITAL_BASED_OUTPATIENT_CLINIC_OR_DEPARTMENT_OTHER)
Admission: EM | Admit: 2016-08-09 | Discharge: 2016-08-09 | Disposition: A | Discharge status: Home / Self Care | Payer: BLUE CROSS/BLUE SHIELD | Attending: Emergency Medicine | Admitting: Emergency Medicine

## 2016-08-09 ENCOUNTER — Encounter (HOSPITAL_BASED_OUTPATIENT_CLINIC_OR_DEPARTMENT_OTHER): Payer: Self-pay | Admitting: Emergency Medicine

## 2016-08-09 ENCOUNTER — Emergency Department (HOSPITAL_BASED_OUTPATIENT_CLINIC_OR_DEPARTMENT_OTHER): Payer: BLUE CROSS/BLUE SHIELD

## 2016-08-09 DIAGNOSIS — R1030 Lower abdominal pain, unspecified: Secondary | ICD-10-CM | POA: Diagnosis present

## 2016-08-09 DIAGNOSIS — N730 Acute parametritis and pelvic cellulitis: Secondary | ICD-10-CM

## 2016-08-09 DIAGNOSIS — N739 Female pelvic inflammatory disease, unspecified: Secondary | ICD-10-CM | POA: Diagnosis not present

## 2016-08-09 DIAGNOSIS — F17210 Nicotine dependence, cigarettes, uncomplicated: Secondary | ICD-10-CM | POA: Insufficient documentation

## 2016-08-09 DIAGNOSIS — Z79811 Long term (current) use of aromatase inhibitors: Secondary | ICD-10-CM | POA: Insufficient documentation

## 2016-08-09 DIAGNOSIS — B9689 Other specified bacterial agents as the cause of diseases classified elsewhere: Secondary | ICD-10-CM

## 2016-08-09 DIAGNOSIS — N760 Acute vaginitis: Secondary | ICD-10-CM | POA: Insufficient documentation

## 2016-08-09 HISTORY — DX: Anxiety disorder, unspecified: F41.9

## 2016-08-09 LAB — URINALYSIS, ROUTINE W REFLEX MICROSCOPIC
Bilirubin Urine: NEGATIVE
GLUCOSE, UA: NEGATIVE mg/dL
Hgb urine dipstick: NEGATIVE
KETONES UR: NEGATIVE mg/dL
NITRITE: NEGATIVE
PH: 6.5 (ref 5.0–8.0)
Protein, ur: NEGATIVE mg/dL
Specific Gravity, Urine: 1.015 (ref 1.005–1.030)

## 2016-08-09 LAB — COMPREHENSIVE METABOLIC PANEL
ALBUMIN: 3.6 g/dL (ref 3.5–5.0)
ALK PHOS: 68 U/L (ref 38–126)
ALT: 15 U/L (ref 14–54)
AST: 17 U/L (ref 15–41)
Anion gap: 8 (ref 5–15)
BILIRUBIN TOTAL: 0.5 mg/dL (ref 0.3–1.2)
BUN: 6 mg/dL (ref 6–20)
CALCIUM: 8.5 mg/dL — AB (ref 8.9–10.3)
CO2: 25 mmol/L (ref 22–32)
CREATININE: 0.52 mg/dL (ref 0.44–1.00)
Chloride: 105 mmol/L (ref 101–111)
GFR calc Af Amer: >60 mL/min (ref >60)
GFR calc non Af Amer: >60 mL/min (ref >60)
GLUCOSE: 94 mg/dL (ref 65–99)
Potassium: 3.8 mmol/L (ref 3.5–5.1)
SODIUM: 138 mmol/L (ref 135–145)
TOTAL PROTEIN: 6.4 g/dL — AB (ref 6.5–8.1)

## 2016-08-09 LAB — CBC WITH DIFFERENTIAL/PLATELET
BASOS PCT: 1 %
Basophils Absolute: 0 10*3/uL (ref 0.0–0.1)
EOS ABS: 0.1 10*3/uL (ref 0.0–0.7)
Eosinophils Relative: 1 %
HEMATOCRIT: 36.3 % (ref 36.0–46.0)
HEMOGLOBIN: 12.1 g/dL (ref 12.0–15.0)
LYMPHS ABS: 1.9 10*3/uL (ref 0.7–4.0)
Lymphocytes Relative: 23 %
MCH: 28.5 pg (ref 26.0–34.0)
MCHC: 33.3 g/dL (ref 30.0–36.0)
MCV: 85.6 fL (ref 78.0–100.0)
MONOS PCT: 9 %
Monocytes Absolute: 0.8 10*3/uL (ref 0.1–1.0)
NEUTROS ABS: 5.6 10*3/uL (ref 1.7–7.7)
NEUTROS PCT: 66 %
Platelets: 283 10*3/uL (ref 150–400)
RBC: 4.24 MIL/uL (ref 3.87–5.11)
RDW: 16.2 % — ABNORMAL HIGH (ref 11.5–15.5)
WBC: 8.4 10*3/uL (ref 4.0–10.5)

## 2016-08-09 LAB — WET PREP, GENITAL
SPERM: NONE SEEN
Trich, Wet Prep: NONE SEEN
Yeast Wet Prep HPF POC: NONE SEEN

## 2016-08-09 LAB — LIPASE, BLOOD: Lipase: 17 U/L (ref 11–51)

## 2016-08-09 LAB — URINALYSIS, MICROSCOPIC (REFLEX)

## 2016-08-09 LAB — PREGNANCY, URINE: Preg Test, Ur: NEGATIVE

## 2016-08-09 MED ORDER — DOXYCYCLINE HYCLATE 100 MG PO TABS
100.0000 mg | ORAL_TABLET | Freq: Once | ORAL | Status: AC
  Administered 2016-08-09: 100 mg via ORAL
  Filled 2016-08-09: qty 1

## 2016-08-09 MED ORDER — CEFTRIAXONE SODIUM 250 MG IJ SOLR
250.0000 mg | Freq: Once | INTRAMUSCULAR | Status: AC
  Administered 2016-08-09: 250 mg via INTRAMUSCULAR
  Filled 2016-08-09: qty 250

## 2016-08-09 MED ORDER — IBUPROFEN 600 MG PO TABS: 600.0000 mg | ORAL_TABLET | Freq: Four times a day (QID) | ORAL | 0 refills | Status: DC | PRN

## 2016-08-09 MED ORDER — DOXYCYCLINE HYCLATE 100 MG PO CAPS: 100.0000 mg | ORAL_CAPSULE | Freq: Two times a day (BID) | ORAL | 0 refills | Status: AC

## 2016-08-09 MED ORDER — METRONIDAZOLE 500 MG PO TABS: 500.0000 mg | ORAL_TABLET | Freq: Two times a day (BID) | ORAL | 0 refills | Status: DC

## 2016-08-09 MED ORDER — KETOROLAC TROMETHAMINE 30 MG/ML IJ SOLN
30.0000 mg | Freq: Once | INTRAMUSCULAR | Status: AC
  Administered 2016-08-09: 30 mg via INTRAVENOUS
  Filled 2016-08-09: qty 1

## 2016-08-09 NOTE — ED Notes (Signed)
ED Provider at bedside. 

## 2016-08-09 NOTE — Discharge Instructions (Signed)
You have been seen today for pain in the lower abdomen. There are signs of inflammation on the pelvic exam. There are many possible reasons for this. You are being treated for these.  You also have signs of bacterial vaginosis on your wet prep.  Please take all of your antibiotics until finished!   You may develop abdominal discomfort or diarrhea from the antibiotic.  You may help offset this with probiotics which you can buy or get in yogurt. Do not eat or take the probiotics until 2 hours after your antibiotic.   You have been tested for STDs. Some of these results are still pending. Any abnormalities will be called to you. You have been prophylactically treated for gonorrhea and Chlamydia. This does not mean you necessarily have these diseases, treatment is precautionary. Be sure to follow safe sex practices, including monogamy and/or condom use.  Follow-up with your primary care provider or OB/GYN on this matter.

## 2016-08-09 NOTE — ED Provider Notes (Signed)
[facility] Provider Note   CSN: [id number] Arrival date & time: 08/09/16  0831     History   Chief Complaint Chief Complaint  Patient presents with  . Flank Pain    HPI Ruth Kennedy is a 46 y.o. female.  HPI   Ruth Kennedy is a 46 y.o. female, with a history of anxiety and GERD, presenting to the ED with lower abdominal pain beginning about three weeks ago. Waxing and waning, 6/10, feels like an intense pressure. Began to have lower back pain "near the butt" two days ago that feels like a dull ache. She has not experienced either of these issues before. She has tried ibuprofen intermittently without complete relief. Endorses some dysuria, abdominal distention, and abnormally increased vaginal discharge. Last BM this morning and normal.   Denies fever/chills, N/V/D, vaginal bleeding, hematochezia/melena, or any other complaints.   LMP "in April sometime." Started to get irregular about 6 months ago. Sexually active with one female partner.    Past Medical History:  Diagnosis Date  . Anxiety   . GERD (gastroesophageal reflux disease)     There are no active problems to display for this patient.   Past Surgical History:  Procedure Laterality Date  . TUBAL LIGATION      OB History    No data available       Home Medications    Prior to Admission medications   Medication Sig Start Date End Date Taking? Authorizing Provider  Escitalopram Oxalate (LEXAPRO PO) Take 10 mg by mouth every morning.    Yes [provider]  famotidine (PEPCID AC) 10 MG chewable tablet Chew 10 mg by mouth 2 (two) times daily.   Yes [provider]  METHOCARBAMOL PO Take 500 mg by mouth as needed.    Yes [provider]  doxycycline (VIBRAMYCIN) 100 MG capsule Take 1 capsule (100 mg total) by mouth 2 (two) times daily. 08/09/16 08/23/16  Shamekia Tippets C, PA-C  ibuprofen (ADVIL,MOTRIN) 600 MG tablet Take 1 tablet (600 mg total) by mouth every 6 (six)  hours as needed. 08/09/16   Jayr Lupercio C, PA-C  metroNIDAZOLE (FLAGYL) 500 MG tablet Take 1 tablet (500 mg total) by mouth 2 (two) times daily. 08/09/16   Author [place], Helane Gunther, PA-C    Family History No family history on file.  Social History Social History  Substance Use Topics  . Smoking status: Current Every Day Smoker    Packs/day: 1.00    Types: Cigarettes  . Smokeless tobacco: Never Used  . Alcohol use Yes     Comment: social     Allergies   Protonix [pantoprazole sodium] and Penicillins   Review of Systems Review of Systems  Constitutional: Positive for fatigue. Negative for chills, diaphoresis and fever.  Respiratory: Negative for shortness of breath.   Cardiovascular: Negative for chest pain.  Gastrointestinal: Positive for abdominal distention and abdominal pain. Negative for blood in stool, constipation, diarrhea, nausea and vomiting.  Genitourinary: Positive for dysuria and vaginal discharge.  Musculoskeletal: Positive for back pain.  All other systems reviewed and are negative.    Physical Exam Updated Vital Signs BP 125/79 (BP Location: Right Arm)   Pulse 83   Temp 98.6 F (37 C) (Oral)   Resp 18   Ht 5' (1.524 m)   Wt 79.4 kg (175 lb)   LMP 06/22/2016   SpO2 100%   BMI 34.18 kg/m   Physical Exam  Constitutional: She appears well-developed and well-nourished.  No distress.  HENT:  Head: Normocephalic and atraumatic.  Eyes: Conjunctivae are normal.  Neck: Neck supple.  Cardiovascular: Normal rate, regular rhythm, normal heart sounds and intact distal pulses.   Pulmonary/Chest: Effort normal and breath sounds normal. No respiratory distress.  Abdominal: Soft. There is tenderness in the left lower quadrant. There is no guarding and no CVA tenderness.  Genitourinary:  Genitourinary Comments: External genitalia normal Vagina with discharge - moderate, whitish-green discharge in vaginal vault Cervix  abnormal  - bright red, appears irritated, consistent with  cervicitis positive for cervical motion tenderness Adnexa palpated, no masses, positive for tenderness noted, especially on the left Bladder palpated negative for tenderness Uterus palpated no masses, negative for tenderness  Otherwise normal female genitalia. Med Tech served as chaperone during exam.  Musculoskeletal: She exhibits tenderness. She exhibits no edema.  Minor tenderness near the left sacral region.  Lymphadenopathy:    She has no cervical adenopathy.       Right: No inguinal adenopathy present.       Left: No inguinal adenopathy present.  Neurological: She is alert.  Skin: Skin is warm and dry. She is not diaphoretic.  Psychiatric: She has a normal mood and affect. Her behavior is normal.  Nursing note and vitals reviewed.    ED Treatments / Results  Labs (all labs ordered are listed, but only abnormal results are displayed) Labs Reviewed  WET PREP, GENITAL - Abnormal; Notable for the following:       Result Value   Clue Cells Wet Prep HPF POC PRESENT (*)    WBC, Wet Prep HPF POC MANY (*)    All other components within normal limits  URINALYSIS, ROUTINE W REFLEX MICROSCOPIC - Abnormal; Notable for the following:    Leukocytes, UA TRACE (*)    All other components within normal limits  CBC WITH DIFFERENTIAL/PLATELET - Abnormal; Notable for the following:    RDW 16.2 (*)    All other components within normal limits  COMPREHENSIVE METABOLIC PANEL - Abnormal; Notable for the following:    Calcium 8.5 (*)    Total Protein 6.4 (*)    All other components within normal limits  URINALYSIS, MICROSCOPIC (REFLEX) - Abnormal; Notable for the following:    Bacteria, UA MANY (*)    Squamous Epithelial / LPF 0-5 (*)    All other components within normal limits  URINE CULTURE  PREGNANCY, URINE  LIPASE, BLOOD  RPR  HIV ANTIBODY (ROUTINE TESTING)  GC/CHLAMYDIA PROBE AMP ([place]) NOT AT [facility]    EKG  EKG Interpretation None       Radiology US Transvaginal  Non-ob  Result Date: 08/09/2016 CLINICAL DATA:  Left flank pain radiating into left lower abdomen for 3 weeks. History of fibroids. EXAM: TRANSABDOMINAL AND TRANSVAGINAL ULTRASOUND OF PELVIS DOPPLER ULTRASOUND OF OVARIES TECHNIQUE: Both transabdominal and transvaginal ultrasound examinations of the pelvis were performed. Transabdominal technique was performed for global imaging of the pelvis including uterus, ovaries, adnexal regions, and pelvic cul-de-sac. It was necessary to proceed with endovaginal exam following the transabdominal exam to visualize the endometrium and ovaries. Color and duplex Doppler ultrasound was utilized to evaluate blood flow to the ovaries. COMPARISON:  None. FINDINGS: Uterus Measurements: 11.5 x 6.0 x 7.6 cm. Multiple fibroids. The largest fibroid posteriorly in the body measures 4.9 x 3.4 x 4.0 cm. The fibroid in the anterior left fundus measures 3.2 x 2.5 x 3.7 cm. Endometrium Thickness: 9.5 mm.  No focal abnormality visualized. Right ovary Measurements: 2.1  x 1.1 x 1.3 cm. The right ovary is only seen endovaginally. It is located deep to the uterus and it was difficult to obtain good Doppler images. However, there does appear to be blood flow in this normal size normal appearing ovary. Left ovary Measurements: 1.5 x 3.2 x 2.7 cm. Normal appearance/no adnexal mass. Pulsed Doppler evaluation of both ovaries demonstrates normal arterial and venous waveforms seen on the left. Evaluation of blood flow on the right ovary is limited but there does appear to be blood flow on the right ovary. Other findings No abnormal free fluid. IMPRESSION: 1. No cause for left-sided pain identified. Blood flow well seen in the left ovary. 2. It is difficult to document blood flow in the right ovary but there does appear to be blood flow on the right and the right ovary is normal in appearance. 3. Fibroid uterus. Electronically Signed   By: Dorise Bullion III M.D   On: 08/09/2016 11:25   US Pelvis  Complete  Result Date: 08/09/2016 CLINICAL DATA:  Left flank pain radiating into left lower abdomen for 3 weeks. History of fibroids. EXAM: TRANSABDOMINAL AND TRANSVAGINAL ULTRASOUND OF PELVIS DOPPLER ULTRASOUND OF OVARIES TECHNIQUE: Both transabdominal and transvaginal ultrasound examinations of the pelvis were performed. Transabdominal technique was performed for global imaging of the pelvis including uterus, ovaries, adnexal regions, and pelvic cul-de-sac. It was necessary to proceed with endovaginal exam following the transabdominal exam to visualize the endometrium and ovaries. Color and duplex Doppler ultrasound was utilized to evaluate blood flow to the ovaries. COMPARISON:  None. FINDINGS: Uterus Measurements: 11.5 x 6.0 x 7.6 cm. Multiple fibroids. The largest fibroid posteriorly in the body measures 4.9 x 3.4 x 4.0 cm. The fibroid in the anterior left fundus measures 3.2 x 2.5 x 3.7 cm. Endometrium Thickness: 9.5 mm.  No focal abnormality visualized. Right ovary Measurements: 2.1 x 1.1 x 1.3 cm. The right ovary is only seen endovaginally. It is located deep to the uterus and it was difficult to obtain good Doppler images. However, there does appear to be blood flow in this normal size normal appearing ovary. Left ovary Measurements: 1.5 x 3.2 x 2.7 cm. Normal appearance/no adnexal mass. Pulsed Doppler evaluation of both ovaries demonstrates normal arterial and venous waveforms seen on the left. Evaluation of blood flow on the right ovary is limited but there does appear to be blood flow on the right ovary. Other findings No abnormal free fluid. IMPRESSION: 1. No cause for left-sided pain identified. Blood flow well seen in the left ovary. 2. It is difficult to document blood flow in the right ovary but there does appear to be blood flow on the right and the right ovary is normal in appearance. 3. Fibroid uterus. Electronically Signed   By: Dorise Bullion III M.D   On: 08/09/2016 11:25   [place] Art/ven  Flow Abd Pelv Doppler  Result Date: 08/09/2016 CLINICAL DATA:  Left flank pain radiating into left lower abdomen for 3 weeks. History of fibroids. EXAM: TRANSABDOMINAL AND TRANSVAGINAL ULTRASOUND OF PELVIS DOPPLER ULTRASOUND OF OVARIES TECHNIQUE: Both transabdominal and transvaginal ultrasound examinations of the pelvis were performed. Transabdominal technique was performed for global imaging of the pelvis including uterus, ovaries, adnexal regions, and pelvic cul-de-sac. It was necessary to proceed with endovaginal exam following the transabdominal exam to visualize the endometrium and ovaries. Color and duplex Doppler ultrasound was utilized to evaluate blood flow to the ovaries. COMPARISON:  None. FINDINGS: Uterus Measurements: 11.5 x 6.0 x 7.6 cm.  Multiple fibroids. The largest fibroid posteriorly in the body measures 4.9 x 3.4 x 4.0 cm. The fibroid in the anterior left fundus measures 3.2 x 2.5 x 3.7 cm. Endometrium Thickness: 9.5 mm.  No focal abnormality visualized. Right ovary Measurements: 2.1 x 1.1 x 1.3 cm. The right ovary is only seen endovaginally. It is located deep to the uterus and it was difficult to obtain good Doppler images. However, there does appear to be blood flow in this normal size normal appearing ovary. Left ovary Measurements: 1.5 x 3.2 x 2.7 cm. Normal appearance/no adnexal mass. Pulsed Doppler evaluation of both ovaries demonstrates normal arterial and venous waveforms seen on the left. Evaluation of blood flow on the right ovary is limited but there does appear to be blood flow on the right ovary. Other findings No abnormal free fluid. IMPRESSION: 1. No cause for left-sided pain identified. Blood flow well seen in the left ovary. 2. It is difficult to document blood flow in the right ovary but there does appear to be blood flow on the right and the right ovary is normal in appearance. 3. Fibroid uterus. Electronically Signed   By: Dorise Bullion III M.D   On: 08/09/2016 11:25     Procedures Pelvic exam Date/Time: 08/09/2016 9:45 AM Performed by: Lorayne Bender Authorized by: Lorayne Bender  Consent: Verbal consent obtained. Risks and benefits: risks, benefits and alternatives were discussed Consent given by: patient Patient understanding: patient states understanding of the procedure being performed Patient consent: the patient's understanding of the procedure matches consent given Procedure consent: procedure consent matches procedure scheduled Patient identity confirmed: verbally with patient and arm band Local anesthesia used: no  Anesthesia: Local anesthesia used: no  Sedation: Patient sedated: no Patient tolerance: Patient tolerated the procedure well with no immediate complications    (including critical care time)  Medications Ordered in ED Medications  ketorolac (TORADOL) 30 MG/ML injection 30 mg (30 mg Intravenous Given 08/09/16 0952)  cefTRIAXone (ROCEPHIN) injection 250 mg (250 mg Intramuscular Given 08/09/16 1000)  doxycycline (VIBRA-TABS) tablet 100 mg (100 mg Oral Given 08/09/16 1000)     Initial Impression / Assessment and Plan / ED Course  I have reviewed the triage vital signs and the nursing notes.  Pertinent labs & imaging results that were available during my care of the patient were reviewed by me and considered in my medical decision making (see chart for details).      Patient presents with abdominal pain. Patient is nontoxic appearing, afebrile, not tachycardic, not tachypneic, not hypotensive, maintains SPO2 of 100% on room air, and is in no apparent distress. No acute abnormality on ultrasound. Suspect PID. Patient treated accordingly. Cultures pending. The patient was given instructions for follow up as well as return precautions. Patient voices understanding of these instructions, accepts the plan, and is comfortable with discharge.   Vitals:   08/09/16 0848 08/09/16 1114 08/09/16 1230  BP: 125/79 114/79 98/66  Pulse: 83  71 68  Resp: 18 16 18   Temp: 98.6 F (37 C)    TempSrc: Oral    SpO2: 100% 100% 100%  Weight: 79.4 kg (175 lb)    Height: 5' (1.524 m)       Final Clinical Impressions(s) / ED Diagnoses   Final diagnoses:  PID (acute pelvic inflammatory disease)  BV (bacterial vaginosis)    New Prescriptions Discharge Medication List as of 08/09/2016 11:53 AM    START taking these medications   Details  ibuprofen (ADVIL,MOTRIN) 600  MG tablet Take 1 tablet (600 mg total) by mouth every 6 (six) hours as needed., Starting Sun 08/09/2016, Print    metroNIDAZOLE (FLAGYL) 500 MG tablet Take 1 tablet (500 mg total) by mouth 2 (two) times daily., Starting Sun 08/09/2016, Print         Milica Gully, [place], PA-C 08/09/16 1656    Lorayne Bender, PA-C 08/09/16 1657    Quintella Reichert, MD 08/10/16 0900

## 2016-08-09 NOTE — ED Triage Notes (Signed)
Left flank pain at intervals x 3 weeks , worse over the weekend. Pain radiates to left lower abd. Denies n/v

## 2016-08-10 LAB — GC/CHLAMYDIA PROBE AMP (~~LOC~~) NOT AT ARMC
CHLAMYDIA, DNA PROBE: NEGATIVE
NEISSERIA GONORRHEA: NEGATIVE

## 2016-08-10 LAB — URINE CULTURE: Culture: NO GROWTH

## 2016-08-10 LAB — RPR: RPR: NONREACTIVE

## 2016-08-10 LAB — HIV ANTIBODY (ROUTINE TESTING W REFLEX): HIV SCREEN 4TH GENERATION: NONREACTIVE

## 2016-11-30 ENCOUNTER — Emergency Department (HOSPITAL_BASED_OUTPATIENT_CLINIC_OR_DEPARTMENT_OTHER)
Admission: EM | Admit: 2016-11-30 | Discharge: 2016-11-30 | Disposition: A | Discharge status: Home / Self Care | Payer: BLUE CROSS/BLUE SHIELD | Attending: Emergency Medicine | Admitting: Emergency Medicine

## 2016-11-30 ENCOUNTER — Emergency Department (HOSPITAL_BASED_OUTPATIENT_CLINIC_OR_DEPARTMENT_OTHER): Payer: BLUE CROSS/BLUE SHIELD

## 2016-11-30 ENCOUNTER — Encounter (HOSPITAL_BASED_OUTPATIENT_CLINIC_OR_DEPARTMENT_OTHER): Payer: Self-pay

## 2016-11-30 DIAGNOSIS — Z79899 Other long term (current) drug therapy: Secondary | ICD-10-CM | POA: Insufficient documentation

## 2016-11-30 DIAGNOSIS — J069 Acute upper respiratory infection, unspecified: Secondary | ICD-10-CM | POA: Insufficient documentation

## 2016-11-30 DIAGNOSIS — R05 Cough: Secondary | ICD-10-CM | POA: Diagnosis present

## 2016-11-30 DIAGNOSIS — F17210 Nicotine dependence, cigarettes, uncomplicated: Secondary | ICD-10-CM | POA: Diagnosis not present

## 2016-11-30 DIAGNOSIS — B9789 Other viral agents as the cause of diseases classified elsewhere: Secondary | ICD-10-CM | POA: Diagnosis not present

## 2016-11-30 MED ORDER — HYDROCODONE-HOMATROPINE 5-1.5 MG/5ML PO SYRP: 5.0000 mL | ORAL_SOLUTION | Freq: Four times a day (QID) | ORAL | 0 refills | Status: DC | PRN

## 2016-11-30 MED FILL — HYDROCODONE-HOMATROPINE SOL: 5-1.5 | 6 days supply | Qty: 120 | Fill #0

## 2016-11-30 NOTE — ED Triage Notes (Signed)
Patient arrives with complaint of productive cough and congestion. Pt states this cough started on Thursday 10/4.

## 2016-11-30 NOTE — ED Provider Notes (Signed)
[place] Provider Note   CSN: [id number] Arrival date & time: 11/30/16  0803     History   Chief Complaint Chief Complaint  Patient presents with  . Cough    HPI Ruth Kennedy is a 46 y.o. female.  Patient is a 46 year old female who presents with cough and cold symptoms. She reports a 4 to five-day history of runny nose nasal congestion and cough which is productive of some yellow sputum. She reported a fever earlier this morning but didn't check her temperature. She states that she took an over-the-counter cold medicine this morning and her fever has improved. She feels like her ears are clogged up. She denies any vomiting. No shortness of breath. No abdominal pain. She is using over-the-counter medicines without improvement in symptoms.      Past Medical History:  Diagnosis Date  . Anxiety   . GERD (gastroesophageal reflux disease)     There are no active problems to display for this patient.   Past Surgical History:  Procedure Laterality Date  . TUBAL LIGATION      OB History    No data available       Home Medications    Prior to Admission medications   Medication Sig Start Date End Date Taking? Authorizing Provider  Escitalopram Oxalate (LEXAPRO PO) Take 10 mg by mouth every morning.    Yes [provider]  famotidine (PEPCID AC) 10 MG chewable tablet Chew 10 mg by mouth 2 (two) times daily.   Yes [provider]  HYDROcodone-homatropine (HYCODAN) 5-1.5 MG/5ML syrup Take 5 mLs by mouth every 6 (six) hours as needed for cough. 11/30/16   Malvin Johns, MD  ibuprofen (ADVIL,MOTRIN) 600 MG tablet Take 1 tablet (600 mg total) by mouth every 6 (six) hours as needed. 08/09/16   Joy, Shawn C, PA-C  METHOCARBAMOL PO Take 500 mg by mouth as needed.     [provider]    Family History No family history on file.  Social History Social History  Substance Use Topics  . Smoking status: Current Every Day Smoker   Packs/day: 1.00    Types: Cigarettes  . Smokeless tobacco: Never Used  . Alcohol use Yes     Comment: social     Allergies   Protonix [pantoprazole sodium] and Penicillins   Review of Systems Review of Systems  Constitutional: Positive for fatigue and fever. Negative for chills and diaphoresis.  HENT: Positive for congestion, postnasal drip and rhinorrhea. Negative for sneezing.   Eyes: Negative.   Respiratory: Positive for cough. Negative for chest tightness and shortness of breath.   Cardiovascular: Negative for chest pain and leg swelling.  Gastrointestinal: Negative for abdominal pain, blood in stool, diarrhea, nausea and vomiting.  Genitourinary: Negative for difficulty urinating, flank pain, frequency and hematuria.  Musculoskeletal: Negative for arthralgias and back pain.  Skin: Negative for rash.  Neurological: Negative for dizziness, speech difficulty, weakness, numbness and headaches.     Physical Exam Updated Vital Signs BP 129/81 (BP Location: Right Arm)   Pulse 94   Temp 98.5 F (36.9 C) (Oral)   Resp 18   Ht 5' (1.524 m)   Wt 77.1 kg (170 lb)   SpO2 100%   BMI 33.20 kg/m   Physical Exam  Constitutional: She is oriented to person, place, and time. She appears well-developed and well-nourished.  HENT:  Head: Normocephalic and atraumatic.  Right Ear: External ear normal.  Left Ear: External ear normal.  Mouth/Throat:  Oropharynx is clear and moist.  Eyes: Pupils are equal, round, and reactive to light.  Neck: Normal range of motion. Neck supple.  Cardiovascular: Normal rate, regular rhythm and normal heart sounds.   Pulmonary/Chest: Effort normal and breath sounds normal. No respiratory distress. She has no wheezes. She has no rales. She exhibits no tenderness.  Abdominal: Soft. Bowel sounds are normal. There is no tenderness. There is no rebound and no guarding.  Musculoskeletal: Normal range of motion. She exhibits no edema.  Lymphadenopathy:    She  has no cervical adenopathy.  Neurological: She is alert and oriented to person, place, and time.  Skin: Skin is warm and dry. No rash noted.  Psychiatric: She has a normal mood and affect.     ED Treatments / Results  Labs (all labs ordered are listed, but only abnormal results are displayed) Labs Reviewed - No data to display  EKG  EKG Interpretation None       Radiology Dg Chest 2 View  Result Date: 11/30/2016 CLINICAL DATA:  Productive cough. EXAM: CHEST  2 VIEW COMPARISON:  None. FINDINGS: The heart size and mediastinal contours are within normal limits. Both lungs are clear. No pneumothorax or pleural effusion is noted. The visualized skeletal structures are unremarkable. IMPRESSION: No active cardiopulmonary disease. Electronically Signed   By: Marijo Conception, M.D.   On: 11/30/2016 08:52    Procedures Procedures (including critical care time)  Medications Ordered in ED Medications - No data to display   Initial Impression / Assessment and Plan / ED Course  I have reviewed the triage vital signs and the nursing notes.  Pertinent labs & imaging results that were available during my care of the patient were reviewed by me and considered in my medical decision making (see chart for details).     Patient is a 46 year old female who presents with cough and cold symptoms. There is no evidence of pneumonia. She has no hypoxia. No vomiting. No shortness of breath. She's otherwise well-appearing. She was given instructions for symptomatic care for URIs. She was given a prescription for Hycodan cough syrup. She was encouraged to follow-up with her primary care physician if her symptoms are not improving or return here as needed for any worsening symptoms.  Final Clinical Impressions(s) / ED Diagnoses   Final diagnoses:  Viral URI with cough    New Prescriptions New Prescriptions   HYDROCODONE-HOMATROPINE (HYCODAN) 5-1.5 MG/5ML SYRUP    Take 5 mLs by mouth every 6 (six)  hours as needed for cough.     Malvin Johns, MD 11/30/16 [phone]

## 2017-02-23 DIAGNOSIS — K2970 Gastritis, unspecified, without bleeding: Secondary | ICD-10-CM

## 2017-02-23 HISTORY — DX: Gastritis, unspecified, without bleeding: K29.70

## 2017-04-01 ENCOUNTER — Telehealth: Payer: Self-pay | Admitting: Obstetrics & Gynecology

## 2017-04-01 NOTE — Telephone Encounter (Signed)
Called and left a message for patient to call back to schedule an "urgent" new patient doctor referral appointment with our office for uterine fibroid and heavy bleeding.

## 2017-04-02 ENCOUNTER — Ambulatory Visit: Payer: BLUE CROSS/BLUE SHIELD | Admitting: Obstetrics and Gynecology

## 2017-04-02 ENCOUNTER — Encounter: Payer: Self-pay | Admitting: Obstetrics and Gynecology

## 2017-04-02 VITALS — BP 122/76 | HR 70 | Resp 16 | Ht 60.0 in | Wt 191.6 lb

## 2017-04-02 DIAGNOSIS — N393 Stress incontinence (female) (male): Secondary | ICD-10-CM

## 2017-04-02 DIAGNOSIS — R350 Frequency of micturition: Secondary | ICD-10-CM | POA: Diagnosis not present

## 2017-04-02 DIAGNOSIS — N921 Excessive and frequent menstruation with irregular cycle: Secondary | ICD-10-CM | POA: Diagnosis not present

## 2017-04-02 DIAGNOSIS — D219 Benign neoplasm of connective and other soft tissue, unspecified: Secondary | ICD-10-CM

## 2017-04-02 NOTE — Progress Notes (Signed)
47 y.o. [id number] Single African American/Indian female here for evaluation of fibroids.    Menses were regular until last fall.  For the last year, they were heavy.   No menses in November.  Last menses lasted 3 weeks and pad change every 30 - 60 minutes.  Lots of pain as well.  Painful intercourse.   Also having urinary incontinence with cough, laugh, sneezing, and exercise.  Urinary frequency and urgency.  DF - every hour. NF - once a night.  Enuresis yes.  No prior bladder tx.  Regular BMs.  No regular constipation or leakage of stool.  Last [place] was one year ago at [facility].  Multiple fibroids.  Normal ovaries.   Feels tired.  Not taking iron supplementation.  Takes a MVI and calcium.   Has nausea with birth control pills.  Last child weighed 9 pounds and "16 ounces."  Used vacuum.  Had shoulder dystocia.  Had extensive laceration - uncertain if was 3rd or fourth degree laceration.   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  PCP:   [facility] in [place], [place].   Patient's last menstrual period was 02/22/2017 (exact date).     Period Pattern: (!) Irregular Menstrual Control: Maxi pad Menstrual Control Change Freq (Hours): every hour on heaviest day Dysmenorrhea: (!) Severe Dysmenorrhea Symptoms: Cramping     Sexually active: Yes.    The current method of family planning is tubal ligation.    Exercising: Yes.    gym Smoker:  yes  Health Maintenance: Pap: 2017 normal per patient History of abnormal Pap:  no MMG:  04/2015 (See Care Everywhere) normal per patient--hx of cysts in Rt.Breast--[place] Colonoscopy:  n/a BMD:   n/a  Result  n/a TDaP:  03-20-15 Gardasil:   no HIV:2018 Neg Hep C: 2018 Neg     reports that she has been smoking cigarettes.  She has been smoking about 1.00 pack per day. she has never used smokeless tobacco. She reports that she drinks about 0.6 oz of alcohol per week. She reports that she does not use drugs.  Past  Medical History:  Diagnosis Date  . Anxiety   . Dysmenorrhea   . Fibroid   . GERD (gastroesophageal reflux disease)   . Urinary incontinence     Past Surgical History:  Procedure Laterality Date  . TUBAL LIGATION      Current Outpatient Medications  Medication Sig Dispense Refill  . Escitalopram Oxalate (LEXAPRO PO) Take 10 mg by mouth every morning.     . famotidine (PEPCID AC) 10 MG chewable tablet Chew 10 mg by mouth 2 (two) times daily.    Marland Kitchen ibuprofen (ADVIL,MOTRIN) 600 MG tablet Take 1 tablet (600 mg total) by mouth every 6 (six) hours as needed. 30 tablet 0   No current facility-administered medications for this visit.     Family History  Problem Relation Age of Onset  . Thyroid disease Mother   . Breast cancer Maternal Aunt   . Hypertension Maternal Grandmother     ROS:  Pertinent items are noted in HPI.  Otherwise, a comprehensive ROS was negative.  Exam:   BP 122/76 (BP Location: Right Arm, Patient Position: Sitting, Cuff Size: Normal)   Pulse 70   Resp 16   Ht 5' (1.524 m)   Wt 191 lb 9.6 oz (86.9 kg)   LMP 02/22/2017 (Exact Date)   BMI 37.42 kg/m     General appearance: alert, cooperative and appears stated age  Head: Normocephalic, without obvious abnormality, atraumatic Neck: no adenopathy, supple, symmetrical, trachea midline and thyroid normal to inspection and palpation Lungs: clear to auscultation bilaterally Heart: regular rate and rhythm Abdomen: soft, non-tender; no masses, no organomegaly Extremities: extremities normal, atraumatic, no cyanosis or edema Skin: Skin color, texture, turgor normal. No rashes or lesions No abnormal inguinal nodes palpated Neurologic: Grossly normal  Pelvic: External genitalia:  no lesions              Urethra:  normal appearing urethra with no masses, tenderness or lesions              Bartholins and Skenes: normal                 Vagina: normal appearing vagina with normal color and discharge, no lesions               Cervix: no lesions            Bimanual Exam:  Uterus:   13 - 14 week size uterus.  Difficult due to discomfort with exam.               Adnexa: no mass, fullness, tenderness              Rectal exam: Yes.  .  Confirms.              Anus:  normal sphincter tone, no lesions  Chaperone was present for exam.  Assessment:     Menorrhagia with irregular menses.  Uterine fibroids.  Status post BTL.  Smoker over age 16 yo. Stress incontinence.  Urinary frequency and urgency.   Plan:   CBC, TSH. Urine dip.  I discussed fibroids and urinary incontinence. ACOG HOs provided.  Will return for sonohysterogram and EMB.  Procedures and rational for performing them discussed.  I reviewed tx for fibroids - progesterone contraceptives, Depo Lupron, uterine artery embolization, and laparoscopic hysterectomy with bilateral salpingectomy.  We discussed midurethral sling, PT, and pessary use for urinary incontinence.  If surgery is scheduled for her fibroids, we will do urodynamic testing to determine her surgical bladder needs.  She will need to update her mammogram for any hormonal treatment.      __45_____ minutes face to face time of which over 50% was spent in counseling.   After visit summary provided.

## 2017-04-03 LAB — CBC
HEMATOCRIT: 37.9 % (ref 34.0–46.6)
HEMOGLOBIN: 11.8 g/dL (ref 11.1–15.9)
MCH: 27.7 pg (ref 26.6–33.0)
MCHC: 31.1 g/dL — AB (ref 31.5–35.7)
MCV: 89 fL (ref 79–97)
Platelets: 297 10*3/uL (ref 150–379)
RBC: 4.26 x10E6/uL (ref 3.77–5.28)
RDW: 15.9 % — ABNORMAL HIGH (ref 12.3–15.4)
WBC: 10.5 10*3/uL (ref 3.4–10.8)

## 2017-04-03 LAB — TSH: TSH: 0.788 u[IU]/mL (ref 0.450–4.500)

## 2017-04-05 ENCOUNTER — Telehealth: Payer: Self-pay | Admitting: Obstetrics and Gynecology

## 2017-04-05 NOTE — Telephone Encounter (Signed)
Call placed to patient to patient to review benefits for a sonohysterogram with endometrial biopsy. Left voicemail messagerequesting a return call.

## 2017-04-06 NOTE — Telephone Encounter (Signed)
Patient returned call. Reviewed benefit for sonohysterogram with endometrial biopsy. Patient understood and agreeable. Patient ready to schedule. Patient scheduled 04/22/17 with Dr Quincy Simmonds.  Patient declined earlier appointment dates. Patient is aware of appointment date, arrival time and cancellation policy.   Patient had additional questions regarding test and procedure. Call routed to Glorianne Manchester, RN to address questions.  Routing to Glorianne Manchester, Therapist, sports

## 2017-04-06 NOTE — Telephone Encounter (Signed)
Spoke with patient reviewed SHGM and EMB procedure, questions answered. Advised to take Motrin 800 mg with food and water one hour before procedure. Patient verbalizes understanding and is agreeable.   Routing to provider for final review. Patient is agreeable to disposition. Will close encounter.

## 2017-04-16 ENCOUNTER — Emergency Department (HOSPITAL_BASED_OUTPATIENT_CLINIC_OR_DEPARTMENT_OTHER)
Admission: EM | Admit: 2017-04-16 | Discharge: 2017-04-16 | Disposition: A | Discharge status: Home / Self Care | Payer: BLUE CROSS/BLUE SHIELD | Attending: Emergency Medicine | Admitting: Emergency Medicine

## 2017-04-16 ENCOUNTER — Other Ambulatory Visit: Payer: Self-pay

## 2017-04-16 ENCOUNTER — Encounter (HOSPITAL_BASED_OUTPATIENT_CLINIC_OR_DEPARTMENT_OTHER): Payer: Self-pay | Admitting: Emergency Medicine

## 2017-04-16 DIAGNOSIS — F17210 Nicotine dependence, cigarettes, uncomplicated: Secondary | ICD-10-CM | POA: Diagnosis not present

## 2017-04-16 DIAGNOSIS — B309 Viral conjunctivitis, unspecified: Secondary | ICD-10-CM

## 2017-04-16 DIAGNOSIS — H5789 Other specified disorders of eye and adnexa: Secondary | ICD-10-CM | POA: Diagnosis present

## 2017-04-16 DIAGNOSIS — Z79899 Other long term (current) drug therapy: Secondary | ICD-10-CM | POA: Diagnosis not present

## 2017-04-16 MED ORDER — NAPHAZOLINE-PHENIRAMINE 0.025-0.3 % OP SOLN: 1.0000 [drp] | Freq: Four times a day (QID) | OPHTHALMIC | 0 refills | Status: DC | PRN

## 2017-04-16 MED ORDER — FLUTICASONE PROPIONATE 50 MCG/ACT NA SUSP: 2.0000 | Freq: Every day | NASAL | 1 refills | Status: DC

## 2017-04-16 MED FILL — VISINE-A 0.025-0.3 % SOLN: 0.025-0.3 | 38 days supply | Qty: 15 | Fill #0

## 2017-04-16 MED FILL — FLUTICASONE PROP 50 MCG SPR: 50 | 30 days supply | Qty: 16 | Fill #0

## 2017-04-16 NOTE — ED Triage Notes (Signed)
Itchy eyes for a few days.  Slight redness.  Drainage upon awakening this morning.

## 2017-04-16 NOTE — ED Provider Notes (Signed)
[facility] EMERGENCY DEPARTMENT Provider Note   CSN: [id number] Arrival date & time: 04/16/17  0745     History   Chief Complaint Chief Complaint  Patient presents with  . Eye Problem    HPI Ruth Kennedy is a 47 y.o. female.  HPI   47 year old female with anxiety presents with concern for 2-3 days of bilateral eye itching, redness, and drainage starting this morning.  Reports she has chronic nasal congestion which is overall unchanged.  Reports she has had cough for the last couple days.  Denies fevers, body aches, nausea, vomiting.  Denies any visual changes.  Reports that she is a contact lens wearer, however removed her contact lens.  Denies any trauma to the eyes or other eye exposures.  Past Medical History:  Diagnosis Date  . Anxiety   . Dysmenorrhea   . Fibroid   . GERD (gastroesophageal reflux disease)   . Urinary incontinence     Patient Active Problem List   Diagnosis Date Noted  . Fibroids 04/02/2017    Past Surgical History:  Procedure Laterality Date  . TUBAL LIGATION      OB History    Gravida Para Term Preterm AB Living   3 2 2   1 2    SAB TAB Ectopic Multiple Live Births                   Home Medications    Prior to Admission medications   Medication Sig Start Date End Date Taking? Authorizing Provider  Multiple Vitamins-Minerals (MULTIVITAMIN ADULT PO) Take by mouth.   Yes [provider]  Escitalopram Oxalate (LEXAPRO PO) Take 10 mg by mouth every morning.     [provider]  famotidine (PEPCID AC) 10 MG chewable tablet Chew 10 mg by mouth 2 (two) times daily.    [provider]  fluticasone (FLONASE) 50 MCG/ACT nasal spray Place 2 sprays into both nostrils daily. 04/16/17   Gareth Morgan, MD  ibuprofen (ADVIL,MOTRIN) 600 MG tablet Take 1 tablet (600 mg total) by mouth every 6 (six) hours as needed. 08/09/16   Joy, Shawn C, PA-C  naphazoline-pheniramine (NAPHCON-A) 0.025-0.3 % ophthalmic  solution Place 1 drop into both eyes 4 (four) times daily as needed for eye irritation. 04/16/17   Gareth Morgan, MD    Family History Family History  Problem Relation Age of Onset  . Thyroid disease Mother   . Breast cancer Maternal Aunt   . Hypertension Maternal Grandmother     Social History Social History   Tobacco Use  . Smoking status: Current Every Day Smoker    Packs/day: 1.00    Types: Cigarettes  . Smokeless tobacco: Never Used  Substance Use Topics  . Alcohol use: Yes    Alcohol/week: 0.6 oz    Types: 1 Glasses of wine per week    Comment: social  . Drug use: No     Allergies   Protonix [pantoprazole sodium] and Penicillins   Review of Systems Review of Systems  Constitutional: Negative for fever.  HENT: Positive for congestion. Negative for sore throat.   Eyes: Positive for discharge and redness. Negative for visual disturbance.  Respiratory: Positive for cough. Negative for shortness of breath.   Cardiovascular: Negative for chest pain.  Gastrointestinal: Negative for abdominal pain, nausea and vomiting.  Genitourinary: Negative for difficulty urinating.  Musculoskeletal: Negative for back pain and neck pain.  Skin: Negative for rash.  Neurological: Negative for syncope and headaches.  Physical Exam Updated Vital Signs BP 136/85 (BP Location: Right Arm)   Pulse 82   Temp 98.3 F (36.8 C) (Oral)   Resp 16   Ht 5' (1.524 m)   Wt 86.6 kg (191 lb)   LMP 12/24/2016 (Approximate)   SpO2 99%   BMI 37.30 kg/m   Physical Exam  Constitutional: She is oriented to person, place, and time. She appears well-developed and well-nourished. No distress.  HENT:  Head: Normocephalic and atraumatic.  Eyes: EOM are normal. Pupils are equal, round, and reactive to light. Right eye exhibits no chemosis. Left eye exhibits no chemosis. Right conjunctiva is injected. Left conjunctiva is injected. Right eye exhibits normal extraocular motion. Left eye exhibits  normal extraocular motion.  Neck: Normal range of motion.  Cardiovascular: Normal rate, regular rhythm, normal heart sounds and intact distal pulses. Exam reveals no gallop and no friction rub.  No murmur heard. Pulmonary/Chest: Effort normal and breath sounds normal. No respiratory distress. She has no wheezes. She has no rales.  Musculoskeletal: She exhibits no edema or tenderness.  Neurological: She is alert and oriented to person, place, and time.  Skin: Skin is warm and dry. No rash noted. She is not diaphoretic. No erythema.  Nursing note and vitals reviewed.    ED Treatments / Results  Labs (all labs ordered are listed, but only abnormal results are displayed) Labs Reviewed - No data to display  EKG  EKG Interpretation None       Radiology No results found.  Procedures Procedures (including critical care time)  Medications Ordered in ED Medications - No data to display   Initial Impression / Assessment and Plan / ED Course  I have reviewed the triage vital signs and the nursing notes.  Pertinent labs & imaging results that were available during my care of the patient were reviewed by me and considered in my medical decision making (see chart for details).     47 year old female with anxiety presents with concern for 2-3 days of bilateral eye itching, redness, and drainage starting this morning.  Patient with a history of eye trauma, no red flags on exam.  History is not consistent with corneal abrasion or keratitis secondary to contact lenses patient has symptoms bilaterally.  Suspect most likely diagnosis by history and physical exam is viral conjunctivitis.  Discussed recommendation for compresses 4 times daily and further supportive care.  Gave prescription for antihistamine eyedrops, as well as Flonase.  Recommend following up with her eye doctor if symptoms do not improve. Patient discharged in stable condition with understanding of reasons to return.   Final  Clinical Impressions(s) / ED Diagnoses   Final diagnoses:  Viral conjunctivitis    ED Discharge Orders        Ordered    naphazoline-pheniramine (NAPHCON-A) 0.025-0.3 % ophthalmic solution  4 times daily PRN     04/16/17 0822    fluticasone (FLONASE) 50 MCG/ACT nasal spray  Daily     04/16/17 0093       Gareth Morgan, MD 04/16/17 2010

## 2017-04-22 ENCOUNTER — Encounter: Payer: Self-pay | Admitting: Obstetrics and Gynecology

## 2017-04-22 ENCOUNTER — Other Ambulatory Visit: Payer: Self-pay | Admitting: Obstetrics and Gynecology

## 2017-04-22 ENCOUNTER — Ambulatory Visit: Payer: BLUE CROSS/BLUE SHIELD | Admitting: Obstetrics and Gynecology

## 2017-04-22 ENCOUNTER — Other Ambulatory Visit: Payer: Self-pay

## 2017-04-22 ENCOUNTER — Ambulatory Visit (INDEPENDENT_AMBULATORY_CARE_PROVIDER_SITE_OTHER): Payer: BLUE CROSS/BLUE SHIELD

## 2017-04-22 VITALS — BP 110/60 | HR 84 | Resp 16 | Ht 60.0 in | Wt 191.0 lb

## 2017-04-22 DIAGNOSIS — N921 Excessive and frequent menstruation with irregular cycle: Secondary | ICD-10-CM

## 2017-04-22 DIAGNOSIS — D219 Benign neoplasm of connective and other soft tissue, unspecified: Secondary | ICD-10-CM

## 2017-04-22 DIAGNOSIS — Z139 Encounter for screening, unspecified: Secondary | ICD-10-CM

## 2017-04-22 NOTE — Patient Instructions (Signed)

## 2017-04-22 NOTE — Progress Notes (Signed)
GYNECOLOGY  VISIT   HPI: 47 y.o.   Single  African American  female   [id number] with Patient's last menstrual period was 02/22/2017.   here for  Irregular menses, prolonged and heavy bleeding.   Cycle December 31 lasted 3 weeks and with pad change up to every 30 minutes.  Painful menses.   States she is noting brown spotting today.   Normal TSH and Hgb 04/02/17.  Also has stress incontinence.  GYNECOLOGIC HISTORY: Patient's last menstrual period was 02/22/2017. Contraception:  Tubal ligation Menopausal hormone therapy:  none Last mammogram:  04/2015 (See Care Everywhere) normal per patient--hx of cysts in Rt.Breast.  Teola Bradley does it.  Last pap smear:   2017 normal per patient        OB History    Gravida Para Term Preterm AB Living   3 2 2   1 2    SAB TAB Ectopic Multiple Live Births                     Patient Active Problem List   Diagnosis Date Noted  . Fibroids 04/02/2017    Past Medical History:  Diagnosis Date  . Anxiety   . Dysmenorrhea   . Fibroid   . GERD (gastroesophageal reflux disease)   . Urinary incontinence     Past Surgical History:  Procedure Laterality Date  . TUBAL LIGATION      Current Outpatient Medications  Medication Sig Dispense Refill  . Escitalopram Oxalate (LEXAPRO PO) Take 10 mg by mouth every morning.     . famotidine (PEPCID AC) 10 MG chewable tablet Chew 10 mg by mouth 2 (two) times daily.    . fluticasone (FLONASE) 50 MCG/ACT nasal spray Place 2 sprays into both nostrils daily. 16 g 1  . ibuprofen (ADVIL,MOTRIN) 600 MG tablet Take 1 tablet (600 mg total) by mouth every 6 (six) hours as needed. 30 tablet 0  . Multiple Vitamins-Minerals (MULTIVITAMIN ADULT PO) Take by mouth.    . naphazoline-pheniramine (NAPHCON-A) 0.025-0.3 % ophthalmic solution Place 1 drop into both eyes 4 (four) times daily as needed for eye irritation. 15 mL 0   No current facility-administered medications for this visit.      ALLERGIES: Protonix [pantoprazole  sodium]; Latex; and Penicillins  Family History  Problem Relation Age of Onset  . Thyroid disease Mother   . Breast cancer Maternal Aunt   . Hypertension Maternal Grandmother     Social History   Socioeconomic History  . Marital status: Single    Spouse name: Not on file  . Number of children: Not on file  . Years of education: Not on file  . Highest education level: Not on file  Social Needs  . Financial resource strain: Not on file  . Food insecurity - worry: Not on file  . Food insecurity - inability: Not on file  . Transportation needs - medical: Not on file  . Transportation needs - non-medical: Not on file  Occupational History  . Not on file  Tobacco Use  . Smoking status: Current Every Day Smoker    Packs/day: 1.00    Types: Cigarettes  . Smokeless tobacco: Never Used  Substance and Sexual Activity  . Alcohol use: Yes    Alcohol/week: 0.6 oz    Types: 1 Glasses of wine per week    Comment: social  . Drug use: No  . Sexual activity: Yes    Birth control/protection: Surgical    Comment: tubal  Other  Topics Concern  . Not on file  Social History Narrative  . Not on file    ROS:  Pertinent items are noted in HPI.  PHYSICAL EXAMINATION:    BP 110/60 (BP Location: Right Arm, Patient Position: Sitting, Cuff Size: Normal)   Pulse 84   Resp 16   Ht 5' (1.524 m)   Wt 191 lb (86.6 kg)   LMP 02/22/2017   BMI 37.30 kg/m     General appearance: alert, cooperative and appears stated age   Pelvic ultrasound: Multiple fibroids (4) - 1.3 - 2.8 cm.  Looks like 2 are encroaching on the endometrium. EMS 7.93 mm. Ovaries normal Free fluid:  no  Sonohysterogram and EMB Consent for procedures Sterile prep with Hibiclens.  Paracervical block with 10 cc 1% lidocaine.  Tenaculum to anterior cervix. Cannula placed and sterile saline injected.  Small 10 mm possible filling defect and scalloped edges to the endometrium.  Reprep of the cervix with  Hibiclens. Tenaculum to anterior cervical lip.  Pipelle passed x 2 to 9 cm.  Tissue to pathology.   No complications.  Minimal EBL.  ASSESSMENT  Status post BTL. Menorrhagia with irregular menses.  Fibroids.  Some component of encroaching fibroids. Small endometrial mass. Stress incontinence.  Smoker.   PLAN  Discussion of abnormal uterine bleeding, fibroids, endometrial polyp. Fu biopsy result Precautions following procedures given.  We reviewed potential tx options if she does not have a polyp - medical therapy with POPs, Depo Provera or procedural therapy with uterine artery embolization and laparoscopic hysterectomy with bilateral salpingectomy and possible bilateral oophorectomy.  Stress incontinence can be treated surgically at the time of hysteroscopy or hysterectomy.  Will schedule mammogram at [facility] and get prior mammogram from 2018.   An After Visit Summary was printed and given to the patient.  __25____ minutes face to face time of which over 50% was spent in counseling.

## 2017-04-22 NOTE — Progress Notes (Signed)
Scheduled appointment for screening mammogram at the [facility] on 04/27/2017 at 3:40 pm. Patient is agreeable to date and time.

## 2017-04-27 ENCOUNTER — Ambulatory Visit
Admission: RE | Admit: 2017-04-27 | Discharge: 2017-04-27 | Disposition: A | Discharge status: Home / Self Care | Payer: BLUE CROSS/BLUE SHIELD | Source: Ambulatory Visit | Attending: Obstetrics and Gynecology | Admitting: Obstetrics and Gynecology

## 2017-04-27 ENCOUNTER — Telehealth: Payer: Self-pay

## 2017-04-27 DIAGNOSIS — Z139 Encounter for screening, unspecified: Secondary | ICD-10-CM

## 2017-04-27 NOTE — Telephone Encounter (Signed)
Left message to call Lillard Bailon at [phone]. 

## 2017-04-27 NOTE — Telephone Encounter (Signed)
Patient returned call during lunch and left a message on our voice mail requesting a call back.

## 2017-04-27 NOTE — Telephone Encounter (Signed)
Return call to Kaitlyn. °

## 2017-04-27 NOTE — Telephone Encounter (Signed)
Spoke with patient. Results and message given to patient as seen below from [place]. Patient verbalizes understanding and would like to think about her options and return call. Encounter closed.

## 2017-04-27 NOTE — Telephone Encounter (Signed)
Left message to call Suheyla Mortellaro at [phone]. 

## 2017-04-27 NOTE — Telephone Encounter (Signed)
Patient returning Kaitlyn's call. °

## 2017-04-27 NOTE — Telephone Encounter (Signed)
-----   Message from Nunzio Cobbs, MD sent at 04/26/2017  5:22 PM EST ----- Please contact patient with results from her endometrial biopsy which show benign proliferative endometrium.  No abnormal cells or polyp were detected.   She has fibroids, bleeding, pain, and stress incontinence.  If she is looking for definitive therapy for her bleeding, hysterectomy is her solution.  This can be combined with an incontinence surgery.   If she is wishing for medical therapy, we can do progesterone treatment and then physical therapy for her bladder.

## 2017-05-18 ENCOUNTER — Encounter: Payer: Self-pay | Admitting: Obstetrics and Gynecology

## 2017-05-18 ENCOUNTER — Other Ambulatory Visit: Payer: Self-pay

## 2017-05-18 ENCOUNTER — Other Ambulatory Visit (HOSPITAL_COMMUNITY)
Admission: RE | Admit: 2017-05-18 | Discharge: 2017-05-18 | Disposition: A | Discharge status: Home / Self Care | Payer: BLUE CROSS/BLUE SHIELD | Source: Ambulatory Visit | Attending: Obstetrics and Gynecology | Admitting: Obstetrics and Gynecology

## 2017-05-18 ENCOUNTER — Telehealth: Payer: Self-pay | Admitting: Obstetrics and Gynecology

## 2017-05-18 ENCOUNTER — Ambulatory Visit (INDEPENDENT_AMBULATORY_CARE_PROVIDER_SITE_OTHER): Payer: BLUE CROSS/BLUE SHIELD | Admitting: Obstetrics and Gynecology

## 2017-05-18 VITALS — BP 134/70 | HR 80 | Temp 98.7°F | Ht 60.0 in | Wt 195.2 lb

## 2017-05-18 DIAGNOSIS — Z113 Encounter for screening for infections with a predominantly sexual mode of transmission: Secondary | ICD-10-CM

## 2017-05-18 DIAGNOSIS — N921 Excessive and frequent menstruation with irregular cycle: Secondary | ICD-10-CM | POA: Insufficient documentation

## 2017-05-18 DIAGNOSIS — N393 Stress incontinence (female) (male): Secondary | ICD-10-CM

## 2017-05-18 DIAGNOSIS — R32 Unspecified urinary incontinence: Secondary | ICD-10-CM | POA: Insufficient documentation

## 2017-05-18 DIAGNOSIS — D259 Leiomyoma of uterus, unspecified: Secondary | ICD-10-CM | POA: Diagnosis not present

## 2017-05-18 LAB — POCT URINE PREGNANCY: PREG TEST UR: NEGATIVE

## 2017-05-18 LAB — HEMOGLOBIN: Hemoglobin: 11.6

## 2017-05-18 MED ORDER — NORETHINDRONE 0.35 MG PO TABS: 1.0000 | ORAL_TABLET | Freq: Every day | ORAL | 0 refills | Status: DC

## 2017-05-18 NOTE — Progress Notes (Signed)
GYNECOLOGY  VISIT   HPI: 47 y.o.   Single  African American  female   [phone] with Patient's last menstrual period was 05/15/2017 (exact date).   here for heavy vaginal bleeding. Has known fibroids with encroachment on endometrium and SIS showing scalloped edges of endometrium and potential small endometrial mass but negative EMB.   Had prolonged bleeding end of December 2018.  Prior menses end October.   3 days ago menses started heavily.  Woke her up from sleep.  Using overnight pads and changing 4 per day.   Feels tired.  No dizziness or lightheadedness.  Can hear a pulsation in her right ear.   Cramping and bloating.   Having hot and cold flashes.   Taking iron 65 mg daily.  Takes potassium and calcium.  Last intercourse was November 2018.   States a breast cyst for about 3 years. Has multiple breast cysts according to prior imaging 05/09/15.  Still has urinary stress incontinence.  "I cannot do jumping jacks."  UPT: neg Hgb:  11.6   GYNECOLOGIC HISTORY: Patient's last menstrual period was 05/15/2017 (exact date). Contraception:  Tubal Menopausal hormone therapy:  none Last mammogram: 04-27-17 Neg/BiRads1 Last pap smear: 2017 Neg per patient        OB History    Gravida  3   Para  2   Term  2   Preterm      AB  1   Living  2     SAB      TAB      Ectopic      Multiple      Live Births                 Patient Active Problem List   Diagnosis Date Noted  . Fibroids 04/02/2017    Past Medical History:  Diagnosis Date  . Anxiety   . Dysmenorrhea   . Fibroid   . GERD (gastroesophageal reflux disease)   . Urinary incontinence     Past Surgical History:  Procedure Laterality Date  . TUBAL LIGATION      Current Outpatient Medications  Medication Sig Dispense Refill  . Escitalopram Oxalate (LEXAPRO PO) Take 10 mg by mouth every morning.     . famotidine (PEPCID AC) 10 MG chewable tablet Chew 10 mg by mouth 2 (two) times daily.     . Ferrous Sulfate (IRON) 325 (65 Fe) MG TABS Take 1 tablet by mouth daily.    . fluticasone (FLONASE) 50 MCG/ACT nasal spray Place 2 sprays into both nostrils daily. 16 g 1  . ibuprofen (ADVIL,MOTRIN) 600 MG tablet Take 1 tablet (600 mg total) by mouth every 6 (six) hours as needed. 30 tablet 0  . Multiple Vitamins-Minerals (MULTIVITAMIN ADULT PO) Take by mouth.    . naphazoline-pheniramine (NAPHCON-A) 0.025-0.3 % ophthalmic solution Place 1 drop into both eyes 4 (four) times daily as needed for eye irritation. 15 mL 0  . norethindrone (MICRONOR,CAMILA,ERRIN) 0.35 MG tablet Take 1 tablet (0.35 mg total) by mouth daily. 3 Package 0   No current facility-administered medications for this visit.      ALLERGIES: Protonix [pantoprazole sodium]; Latex; and Penicillins  Family History  Problem Relation Age of Onset  . Thyroid disease Mother   . Lupus Mother   . Breast cancer Maternal Aunt   . Hypertension Maternal Grandmother     Social History   Socioeconomic History  . Marital status: Single    Spouse name: Not on file  .  Number of children: Not on file  . Years of education: Not on file  . Highest education level: Not on file  Occupational History  . Not on file  Social Needs  . Financial resource strain: Not on file  . Food insecurity:    Worry: Not on file    Inability: Not on file  . Transportation needs:    Medical: Not on file    Non-medical: Not on file  Tobacco Use  . Smoking status: Current Every Day Smoker    Packs/day: 1.00    Types: Cigarettes  . Smokeless tobacco: Never Used  Substance and Sexual Activity  . Alcohol use: Yes    Alcohol/week: 0.6 oz    Types: 1 Glasses of wine per week    Comment: social  . Drug use: No  . Sexual activity: Yes    Birth control/protection: Surgical    Comment: tubal  Lifestyle  . Physical activity:    Days per week: Not on file    Minutes per session: Not on file  . Stress: Not on file  Relationships  . Social  connections:    Talks on phone: Not on file    Gets together: Not on file    Attends religious service: Not on file    Active member of club or organization: Not on file    Attends meetings of clubs or organizations: Not on file    Relationship status: Not on file  . Intimate partner violence:    Fear of current or ex partner: Not on file    Emotionally abused: Not on file    Physically abused: Not on file    Forced sexual activity: Not on file  Other Topics Concern  . Not on file  Social History Narrative  . Not on file    ROS:  Pertinent items are noted in HPI.  PHYSICAL EXAMINATION:    BP 134/70 (BP Location: Right Arm, Patient Position: Sitting, Cuff Size: Large)   Pulse 80   Temp 98.7 F (37.1 C)   Ht 5' (1.524 m)   Wt 195 lb 3.2 oz (88.5 kg)   LMP 05/15/2017 (Exact Date)   BMI 38.12 kg/m     General appearance: alert, cooperative and appears stated age   Breasts: normal appearance, no masses or tenderness, No nipple retraction or dimpling, No nipple discharge or bleeding, No axillary or supraclavicular adenopathy  Pelvic: External genitalia:  no lesions              Urethra:  normal appearing urethra with no masses, tenderness or lesions              Bartholins and Skenes: normal                 Vagina: normal appearing vagina with normal color and discharge, no lesions              Cervix: no lesions.  Cervix tender.  Small amount of blood in the vagina.                 Bimanual Exam:  Uterus:  normal size, contour, position, consistency, mobility,  Mildly tender.  8 week size.               Adnexa: no mass, fullness, tenderness           Chaperone was present for exam.  ASSESSMENT  Menorrhagia with irregular menses.  Fibroids.  Potential small endometrial mass and negative EMB.  Stress incontinence.   PLAN  Micronor x 3 months.  STD screening for GC/CT. CBC.  Discussed laparoscopic hysterectomy/bilateral salpingectomy/TVT sling/cysto. Will need  urodynamics if decides to do surgery. ACOG HO given.  FU in 2 months.    An After Visit Summary was printed and given to the patient.  __25____ minutes face to face time of which over 50% was spent in counseling.

## 2017-05-18 NOTE — Telephone Encounter (Signed)
Patient called and stated that she has not have menes in 4 months. Came back on 05/15/17 with heavy clotting. Leaking through pads in less than 30 minutes.

## 2017-05-18 NOTE — Telephone Encounter (Signed)
Spoke with patient. Has not had a cycle in 4 months, menses started 05/15/17, bleeding has become heavy with several fifty cent size clots, currently changing saturated extra long overnight pad q 30min. Left work early this afternoon d/t bleeding through clothes. Reports fatigue, denies any other symptoms.   Recommended OV today for further evaluation, patient agreeable to scheduling with covering provider. OV scheduled for 4pm today with Dr. Sabra Heck.   Routing to provider for final review. Patient is agreeable to disposition. Will close encounter.   Cc: Dr. Quincy Simmonds

## 2017-05-19 LAB — CBC
HEMATOCRIT: 35.3 % (ref 34.0–46.6)
HEMOGLOBIN: 11.6 g/dL (ref 11.1–15.9)
MCH: 28.5 pg (ref 26.6–33.0)
MCHC: 32.9 g/dL (ref 31.5–35.7)
MCV: 87 fL (ref 79–97)
Platelets: 277 10*3/uL (ref 150–379)
RBC: 4.07 x10E6/uL (ref 3.77–5.28)
RDW: 16.2 % — ABNORMAL HIGH (ref 12.3–15.4)
WBC: 9.4 10*3/uL (ref 3.4–10.8)

## 2017-05-20 DIAGNOSIS — N393 Stress incontinence (female) (male): Secondary | ICD-10-CM | POA: Insufficient documentation

## 2017-05-20 LAB — CERVICOVAGINAL ANCILLARY ONLY
Chlamydia: NEGATIVE
NEISSERIA GONORRHEA: NEGATIVE

## 2017-07-23 ENCOUNTER — Telehealth: Payer: Self-pay | Admitting: Obstetrics and Gynecology

## 2017-07-23 NOTE — Telephone Encounter (Signed)
Patient called stating that she has been on her cycle since 07/09/17 and is cramping really bad again. Stated that she has an appointment on Monday, but would like to speak with someone before then. Tried to take hormones, but she did not feel well and became confused.

## 2017-07-23 NOTE — Telephone Encounter (Signed)
Spoke with patient. Patient states that she started her menses on 07/09/2017 and is still bleeding. Bleeding lessened this morning, but increased a little after lunch. Is changing her pad every 2-3 hours. Having moderate cramping. Taking Ibuprofen 800 mg every 8 hours for relief. Took 2 days of Micronor, but stopped due to fatigue and "not feeling myself." Patient has an appointment on 07/26/17. Patient declines to be seen today due to work schedule. Advised to keep appointment as scheduled. May continue taking Ibuprofen 800 mg every 8 hours. If bleeding increases to changing pad every hour for more than 2 hours will need to be seen at [facility] over the weekend. Patient verbalizes understanding. Advised will review with Dr.Silva and if she has additional recommendations will return call.

## 2017-07-23 NOTE — Telephone Encounter (Signed)
I agree with your recommendations.  I will see her in the office on 07/26/17.

## 2017-07-26 ENCOUNTER — Encounter: Payer: Self-pay | Admitting: Obstetrics and Gynecology

## 2017-07-26 ENCOUNTER — Ambulatory Visit: Payer: BLUE CROSS/BLUE SHIELD | Admitting: Obstetrics and Gynecology

## 2017-07-26 ENCOUNTER — Other Ambulatory Visit: Payer: Self-pay

## 2017-07-26 VITALS — BP 118/70 | HR 84 | Resp 18 | Wt 192.0 lb

## 2017-07-26 DIAGNOSIS — N939 Abnormal uterine and vaginal bleeding, unspecified: Secondary | ICD-10-CM

## 2017-07-26 DIAGNOSIS — R102 Pelvic and perineal pain: Secondary | ICD-10-CM | POA: Diagnosis not present

## 2017-07-26 MED ORDER — DOXYCYCLINE HYCLATE 100 MG PO CAPS: 100.0000 mg | ORAL_CAPSULE | Freq: Two times a day (BID) | ORAL | 0 refills | Status: DC

## 2017-07-26 NOTE — Patient Instructions (Signed)
Diagnostic Laparoscopy A diagnostic laparoscopy is a procedure to diagnose diseases in the abdomen. During the procedure, a thin, lighted, pencil-sized instrument called a laparoscope is inserted into the abdomen through an incision. The laparoscope allows your health care provider to look at the organs inside your body. Tell a health care provider about:  Any allergies you have.  All medicines you are taking, including vitamins, herbs, eye drops, creams, and over-the-counter medicines.  Any problems you or family members have had with anesthetic medicines.  Any blood disorders you have.  Any surgeries you have had.  Any medical conditions you have. What are the risks? Generally, this is a safe procedure. However, problems can occur, which may include:  Infection.  Bleeding.  Damage to other organs.  Allergic reaction to the anesthetics used during the procedure.  What happens before the procedure?  Do not eat or drink anything after midnight on the night before the procedure or as directed by your health care provider.  Ask your health care provider about: ? Changing or stopping your regular medicines. ? Taking medicines such as aspirin and ibuprofen. These medicines can thin your blood. Do not take these medicines before your procedure if your health care provider instructs you not to.  Plan to have someone take you home after the procedure. What happens during the procedure?  You may be given a medicine to help you relax (sedative).  You will be given a medicine to make you sleep (general anesthetic).  Your abdomen will be inflated with a gas. This will make your organs easier to see.  Small incisions will be made in your abdomen.  A laparoscope and other small instruments will be inserted into the abdomen through the incisions.  A tissue sample may be removed from an organ in the abdomen for examination.  The instruments will be removed from the abdomen.  The  gas will be released.  The incisions will be closed with stitches (sutures). What happens after the procedure? Your blood pressure, heart rate, breathing rate, and blood oxygen level will be monitored often until the medicines you were given have worn off. This information is not intended to replace advice given to you by your health care provider. Make sure you discuss any questions you have with your health care provider. Document Released: 05/18/2000 Document Revised: 06/20/2015 Document Reviewed: 09/22/2013 Elsevier Interactive Patient Education  [employer].  Hysteroscopy Hysteroscopy is a procedure used for looking inside the womb (uterus). It may be done for various reasons, including:  To evaluate abnormal bleeding, fibroid (benign, noncancerous) tumors, polyps, scar tissue (adhesions), and possibly cancer of the uterus.  To look for lumps (tumors) and other uterine growths.  To look for causes of why a woman cannot get pregnant (infertility), causes of recurrent loss of pregnancy (miscarriages), or a lost intrauterine device (IUD).  To perform a sterilization by blocking the fallopian tubes from inside the uterus.  In this procedure, a thin, flexible tube with a tiny light and camera on the end of it (hysteroscope) is used to look inside the uterus. A hysteroscopy should be done right after a menstrual period to be sure you are not pregnant. LET [facility] CARE PROVIDER KNOW ABOUT:  Any allergies you have.  All medicines you are taking, including vitamins, herbs, eye drops, creams, and over-the-counter medicines.  Previous problems you or members of your family have had with the use of anesthetics.  Any blood disorders you have.  Previous surgeries you have  had.  Medical conditions you have. RISKS AND COMPLICATIONS Generally, this is a safe procedure. However, as with any procedure, complications can occur. Possible complications include:  Putting a hole in the  uterus.  Excessive bleeding.  Infection.  Damage to the cervix.  Injury to other organs.  Allergic reaction to medicines.  Too much fluid used in the uterus for the procedure.  BEFORE THE PROCEDURE  Ask your health care provider about changing or stopping any regular medicines.  Do not take aspirin or blood thinners for 1 week before the procedure, or as directed by your health care provider. These can cause bleeding.  If you smoke, do not smoke for 2 weeks before the procedure.  In some cases, a medicine is placed in the cervix the day before the procedure. This medicine makes the cervix have a larger opening (dilate). This makes it easier for the instrument to be inserted into the uterus during the procedure.  Do not eat or drink anything for at least 8 hours before the surgery.  Arrange for someone to take you home after the procedure. PROCEDURE  You may be given a medicine to relax you (sedative). You may also be given one of the following: ? A medicine that numbs the area around the cervix (local anesthetic). ? A medicine that makes you sleep through the procedure (general anesthetic).  The hysteroscope is inserted through the vagina into the uterus. The camera on the hysteroscope sends a picture to a TV screen. This gives the surgeon a good view inside the uterus.  During the procedure, air or a liquid is put into the uterus, which allows the surgeon to see better.  Sometimes, tissue is gently scraped from inside the uterus. These tissue samples are sent to a lab for testing. What to expect after the procedure  If you had a general anesthetic, you may be groggy for a couple hours after the procedure.  If you had a local anesthetic, you will be able to go home as soon as you are stable and feel ready.  You may have some cramping. This normally lasts for a couple days.  You may have bleeding, which varies from light spotting for a few days to menstrual-like bleeding  for 3-7 days. This is normal.  If your test results are not back during the visit, make an appointment with your health care provider to find out the results. This information is not intended to replace advice given to you by your health care provider. Make sure you discuss any questions you have with your health care provider. Document Released: 05/18/2000 Document Revised: 07/18/2015 Document Reviewed: 09/08/2012 Elsevier Interactive Patient Education  2017 [employer].

## 2017-07-26 NOTE — Progress Notes (Signed)
GYNECOLOGY  VISIT   HPI: 47 y.o.   Single  Caucasian  female   [id number] with Patient's last menstrual period was 07/09/2017.   here for follow up Menorrhagia with irregular cycle.  Has heavy vaginal bleeding.  Fibroid and encroachment on endometrium.  SIS showing scalloped edges of endometrium and potential endometrial mass but negative EMB.   Did not tolerate Micronor.  Took it only for 2 days.  Stopped it due to fatigue and felt like she could not really express herself well. Feels back to normal after stopping it.   Bled for 13 days.  Starting bleeding on 07/09/17 and now just spotting.  Having some left sided cramping which is lasting for only a few seconds.  Feeling bloating.  Has a lot of cramping with cycle.  Difficult to have intercourse due to the bleeding.  Had GC/CT on 05/18/17.   Skipped her menses for 4 months from December to April.  Forcing to have her bladder empty.  Feels pressure down on her bladder.  This is an old change.  Leaks urine with a cough or sneeze. Denies blood in her urine.  Bladder is painful when it is full.  States hot flashes for 3 - 4 months.   Smoker.   GYNECOLOGIC HISTORY: Patient's last menstrual period was 07/09/2017. Contraception:  Tubal  Menopausal hormone therapy:  none Last mammogram:  04-27-17 Neg/BiRads1 Last pap smear:   2017 Negative per patient        OB History    Gravida  3   Para  2   Term  2   Preterm      AB  1   Living  2     SAB      TAB      Ectopic      Multiple      Live Births                 Patient Active Problem List   Diagnosis Date Noted  . Stress incontinence 05/20/2017  . Fibroids 04/02/2017    Past Medical History:  Diagnosis Date  . Anxiety   . Dysmenorrhea   . Fibroid   . GERD (gastroesophageal reflux disease)   . Urinary incontinence     Past Surgical History:  Procedure Laterality Date  . TUBAL LIGATION      Current Outpatient Medications  Medication Sig  Dispense Refill  . Escitalopram Oxalate (LEXAPRO PO) Take 10 mg by mouth every morning.     . famotidine (PEPCID AC) 10 MG chewable tablet Chew 10 mg by mouth 2 (two) times daily.    . Ferrous Sulfate (IRON) 325 (65 Fe) MG TABS Take 1 tablet by mouth daily.    . fluticasone (FLONASE) 50 MCG/ACT nasal spray Place 2 sprays into both nostrils daily. 16 g 1  . ibuprofen (ADVIL,MOTRIN) 600 MG tablet Take 1 tablet (600 mg total) by mouth every 6 (six) hours as needed. 30 tablet 0  . Multiple Vitamins-Minerals (MULTIVITAMIN ADULT PO) Take by mouth.    . naphazoline-pheniramine (NAPHCON-A) 0.025-0.3 % ophthalmic solution Place 1 drop into both eyes 4 (four) times daily as needed for eye irritation. 15 mL 0   No current facility-administered medications for this visit.      ALLERGIES: Protonix [pantoprazole sodium]; Latex; and Penicillins  Family History  Problem Relation Age of Onset  . Thyroid disease Mother   . Lupus Mother   . Breast cancer Maternal Aunt   . Hypertension Maternal  Grandmother     Social History   Socioeconomic History  . Marital status: Single    Spouse name: Not on file  . Number of children: Not on file  . Years of education: Not on file  . Highest education level: Not on file  Occupational History  . Not on file  Social Needs  . Financial resource strain: Not on file  . Food insecurity:    Worry: Not on file    Inability: Not on file  . Transportation needs:    Medical: Not on file    Non-medical: Not on file  Tobacco Use  . Smoking status: Current Every Day Smoker    Packs/day: 1.00    Types: Cigarettes  . Smokeless tobacco: Never Used  Substance and Sexual Activity  . Alcohol use: Yes    Alcohol/week: 0.6 oz    Types: 1 Glasses of wine per week    Comment: social  . Drug use: No  . Sexual activity: Yes    Birth control/protection: Surgical    Comment: tubal  Lifestyle  . Physical activity:    Days per week: Not on file    Minutes per session:  Not on file  . Stress: Not on file  Relationships  . Social connections:    Talks on phone: Not on file    Gets together: Not on file    Attends religious service: Not on file    Active member of club or organization: Not on file    Attends meetings of clubs or organizations: Not on file    Relationship status: Not on file  . Intimate partner violence:    Fear of current or ex partner: Not on file    Emotionally abused: Not on file    Physically abused: Not on file    Forced sexual activity: Not on file  Other Topics Concern  . Not on file  Social History Narrative  . Not on file    Review of Systems  Constitutional: Negative.   HENT: Negative.   Eyes: Negative.   Respiratory: Negative.   Cardiovascular: Negative.   Gastrointestinal: Negative.        Bloating   Endocrine: Negative.   Genitourinary: Positive for menstrual problem.       Menorrhagia with irregular cycle Dysmenorrhea   Musculoskeletal: Positive for back pain.  Skin: Negative.   Allergic/Immunologic: Negative.   Neurological: Negative.   Psychiatric/Behavioral: Negative.     PHYSICAL EXAMINATION:    BP 118/70 (BP Location: Right Arm, Patient Position: Sitting, Cuff Size: Normal)   Pulse 84   Resp 18   Wt 192 lb (87.1 kg)   LMP 07/09/2017   BMI 37.50 kg/m     General appearance: alert, cooperative and appears stated age   Pelvic: External genitalia:  no lesions              Urethra:  normal appearing urethra with no masses, tenderness or lesions              Bartholins and Skenes: normal                 Vagina: normal appearing vagina with normal color and tan colored discharge, no lesions              Cervix: no lesions.  Cervix tender.  Uterus tender.                  Bimanual Exam:  Uterus:  normal size, contour,  position, consistency, mobility, non-tender              Adnexa: no mass, fullness, tenderness           Chaperone was present for exam.  ASSESSMENT  Status post BTL. Fibroids.   Abnormal uterine bleeding.  Potential small endometrial mass and negative EMB. Intolerant to [employer].  Stress incontinence.  Pelvic pain.   PLAN  We discussed possible reasons for bleeding and pain such as endometriosis or endometritis.  GC/CT/trichomonas from urine.  Doxycycline 100 mg po bid x 7 days for possible endometritis.  CBC now.  If negative labs and continued pain following abx treatment, will do laparoscopy and hysteroscopy with dilation and curettage. Would consider future childbearing so does not want hysterectomy, salpingectomy, or oophorectomy at this time. No anti-incontinence surgery at this time.    An After Visit Summary was printed and given to the patient.  __25____ minutes face to face time of which over 50% was spent in counseling.

## 2017-07-27 LAB — CBC WITH DIFFERENTIAL/PLATELET
BASOS: 1 %
Basophils Absolute: 0 10*3/uL (ref 0.0–0.2)
EOS (ABSOLUTE): 0.1 10*3/uL (ref 0.0–0.4)
EOS: 1 %
HEMATOCRIT: 39.4 % (ref 34.0–46.6)
Hemoglobin: 12.7 g/dL (ref 11.1–15.9)
IMMATURE GRANULOCYTES: 0 %
Immature Grans (Abs): 0 10*3/uL (ref 0.0–0.1)
LYMPHS: 18 %
Lymphocytes Absolute: 1.4 10*3/uL (ref 0.7–3.1)
MCH: 28.3 pg (ref 26.6–33.0)
MCHC: 32.2 g/dL (ref 31.5–35.7)
MCV: 88 fL (ref 79–97)
Monocytes Absolute: 0.6 10*3/uL (ref 0.1–0.9)
Monocytes: 8 %
NEUTROS PCT: 72 %
Neutrophils Absolute: 5.7 10*3/uL (ref 1.4–7.0)
PLATELETS: 288 10*3/uL (ref 150–450)
RBC: 4.49 x10E6/uL (ref 3.77–5.28)
RDW: 15.4 % (ref 12.3–15.4)
WBC: 7.8 10*3/uL (ref 3.4–10.8)

## 2017-07-27 LAB — CHLAMYDIA/GONOCOCCUS/TRICHOMONAS, NAA
Chlamydia by NAA: NEGATIVE
Gonococcus by NAA: NEGATIVE
Trich vag by NAA: NEGATIVE

## 2017-08-03 ENCOUNTER — Encounter: Payer: Self-pay | Admitting: Gastroenterology

## 2017-08-03 ENCOUNTER — Encounter: Payer: Self-pay | Admitting: Obstetrics and Gynecology

## 2017-08-03 ENCOUNTER — Other Ambulatory Visit: Payer: Self-pay

## 2017-08-03 ENCOUNTER — Ambulatory Visit: Payer: BLUE CROSS/BLUE SHIELD | Admitting: Obstetrics and Gynecology

## 2017-08-03 VITALS — BP 118/70 | HR 84 | Resp 16 | Ht 60.0 in | Wt 192.0 lb

## 2017-08-03 DIAGNOSIS — F419 Anxiety disorder, unspecified: Secondary | ICD-10-CM

## 2017-08-03 DIAGNOSIS — N926 Irregular menstruation, unspecified: Secondary | ICD-10-CM

## 2017-08-03 DIAGNOSIS — R102 Pelvic and perineal pain: Secondary | ICD-10-CM

## 2017-08-03 DIAGNOSIS — R140 Abdominal distension (gaseous): Secondary | ICD-10-CM | POA: Diagnosis not present

## 2017-08-03 NOTE — Progress Notes (Signed)
Spoke with patient while in office. Advised referral will be placed to [facility]. [facility] will verfify insurance benefits and contact the patient directly to schedule. Per [facility] need to speak with the patient directly after reviewing notes and insurance. Insurance card, demographics, and OV note faxed to [facility]. Patient verbalizes understanding.

## 2017-08-03 NOTE — Progress Notes (Signed)
GYNECOLOGY  VISIT   HPI: 47 y.o.   Single  African American  female   [phone] with Patient's last menstrual period was 07/09/2017.   here for 1 week recheck for abnormal uterine bleeding.  Prolonged menses in May.  Skipped cycles from December to April.  States she usually only gets a week or two break from her bleeding.  Doing an empiric course of Doxycycline for presumed endometritis.   Negative GC/CT/trich testing.  Has 6 more doses left of the abx.  Has lower back pain and bloating.  Has not seen GI. Having painful BMs, gastritis, and reflux.  No blood in the stool.  Has pain with bladder is full.  Then the pain goes away when she empties her bladder.   Took Lexapro 5 mg last hs and had watery diarrhea.  Some pain but no blood.  Feels her anxiety is the source of her symptoms for the last 5 years.  Lost weight.  Really wants help with this.  States that Lexapro is not really helping.   PCP - Dr. Laneta Simmers at [facility] in [facility].  GYNECOLOGIC HISTORY: Patient's last menstrual period was 07/09/2017. Contraception:  Tubal Menopausal hormone therapy:  none Last mammogram:   04-27-17 Neg/BiRads1 Last pap smear:   2017 Negative per patient        OB History    Gravida  3   Para  2   Term  2   Preterm      AB  1   Living  2     SAB      TAB      Ectopic      Multiple      Live Births                 Patient Active Problem List   Diagnosis Date Noted  . Stress incontinence 05/20/2017  . Fibroids 04/02/2017    Past Medical History:  Diagnosis Date  . Anxiety   . Dysmenorrhea   . Fibroid   . GERD (gastroesophageal reflux disease)   . Urinary incontinence     Past Surgical History:  Procedure Laterality Date  . TUBAL LIGATION      Current Outpatient Medications  Medication Sig Dispense Refill  . Escitalopram Oxalate (LEXAPRO PO) Take 10 mg by mouth every morning.     . famotidine (PEPCID AC) 10 MG chewable tablet Chew 10 mg  by mouth 2 (two) times daily.    . Ferrous Sulfate (IRON) 325 (65 Fe) MG TABS Take 1 tablet by mouth daily.    . fluticasone (FLONASE) 50 MCG/ACT nasal spray Place 2 sprays into both nostrils daily. 16 g 1  . ibuprofen (ADVIL,MOTRIN) 600 MG tablet Take 1 tablet (600 mg total) by mouth every 6 (six) hours as needed. 30 tablet 0  . Multiple Vitamins-Minerals (MULTIVITAMIN ADULT PO) Take by mouth.    . naphazoline-pheniramine (NAPHCON-A) 0.025-0.3 % ophthalmic solution Place 1 drop into both eyes 4 (four) times daily as needed for eye irritation. 15 mL 0   No current facility-administered medications for this visit.      ALLERGIES: Protonix [pantoprazole sodium]; Latex; and Penicillins  Family History  Problem Relation Age of Onset  . Thyroid disease Mother   . Lupus Mother   . Breast cancer Maternal Aunt   . Hypertension Maternal Grandmother     Social History   Socioeconomic History  . Marital status: Single    Spouse name: Not on file  .  Number of children: Not on file  . Years of education: Not on file  . Highest education level: Not on file  Occupational History  . Not on file  Social Needs  . Financial resource strain: Not on file  . Food insecurity:    Worry: Not on file    Inability: Not on file  . Transportation needs:    Medical: Not on file    Non-medical: Not on file  Tobacco Use  . Smoking status: Current Every Day Smoker    Packs/day: 1.00    Types: Cigarettes  . Smokeless tobacco: Never Used  Substance and Sexual Activity  . Alcohol use: Yes    Alcohol/week: 0.6 oz    Types: 1 Glasses of wine per week    Comment: social  . Drug use: No  . Sexual activity: Yes    Birth control/protection: Surgical    Comment: tubal  Lifestyle  . Physical activity:    Days per week: Not on file    Minutes per session: Not on file  . Stress: Not on file  Relationships  . Social connections:    Talks on phone: Not on file    Gets together: Not on file    Attends  religious service: Not on file    Active member of club or organization: Not on file    Attends meetings of clubs or organizations: Not on file    Relationship status: Not on file  . Intimate partner violence:    Fear of current or ex partner: Not on file    Emotionally abused: Not on file    Physically abused: Not on file    Forced sexual activity: Not on file  Other Topics Concern  . Not on file  Social History Narrative  . Not on file    Review of Systems  Constitutional: Negative.   HENT: Negative.   Eyes: Negative.   Respiratory: Negative.   Cardiovascular: Negative.   Gastrointestinal: Positive for constipation and diarrhea.       Bloating   Endocrine: Negative.   Genitourinary: Negative.   Musculoskeletal: Negative.   Skin: Negative.   Allergic/Immunologic: Negative.   Neurological: Negative.   Hematological: Negative.   Psychiatric/Behavioral:       Severe anxiety    PHYSICAL EXAMINATION:    BP 118/70 (BP Location: Right Arm, Patient Position: Sitting, Cuff Size: Normal)   Pulse 84   Resp 16   Ht 5' (1.524 m)   Wt 192 lb (87.1 kg)   LMP 07/09/2017   BMI 37.50 kg/m     General appearance: alert, cooperative and appears stated age  Pelvic: External genitalia:  no lesions              Urethra:  normal appearing urethra with no masses, tenderness or lesions              Bartholins and Skenes: normal                 Vagina: normal appearing vagina with normal color and discharge, no lesions              Cervix: no lesions                Bimanual Exam:  Uterus:  normal size, contour, position, consistency, mobility, tender              Adnexa: no mass, fullness, tenderness  Chaperone was present for exam.  ASSESSMENT  Status post BTL. Fibroids. Abnormal uterine bleeding.  Probable anovulation.  Potential small endometrial mass and negative EMB. Intolerant to [employer].  Pelvic pain.  Abdominal bloating and painful bowel movements.   Diarrhea recently.  Pain when bladder is full. I think it is possible the patient has endometriosis.  Stress incontinence.  PLAN  Stop Doxycycline.  Start probiotics.  Will need C Diff test if does not improve.  We discussed options for care including hysteroscopy with dilation and curettage and laparoscopy with treatment of endometriosis.  Edward Qualia and Depo Lupron discussed with patient.  I am referring her to GI for further evaluation.  I am also referring her to psychiatry for evaluation and tx of anxiety.  If her bleeding continues to be irregular, we may try a course of Provera 10 mg x 10 days.   An After Visit Summary was printed and given to the patient.  _25_____ minutes face to face time of which over 50% was spent in counseling.

## 2017-08-05 DIAGNOSIS — F419 Anxiety disorder, unspecified: Secondary | ICD-10-CM | POA: Insufficient documentation

## 2017-08-05 DIAGNOSIS — R140 Abdominal distension (gaseous): Secondary | ICD-10-CM | POA: Insufficient documentation

## 2017-08-18 ENCOUNTER — Other Ambulatory Visit: Payer: Self-pay | Admitting: Obstetrics and Gynecology

## 2017-08-18 ENCOUNTER — Other Ambulatory Visit: Payer: Self-pay | Admitting: *Deleted

## 2017-08-18 DIAGNOSIS — F419 Anxiety disorder, unspecified: Secondary | ICD-10-CM

## 2017-08-18 DIAGNOSIS — R140 Abdominal distension (gaseous): Secondary | ICD-10-CM

## 2017-08-18 NOTE — Telephone Encounter (Signed)
Medication refill request: OCP  Last OV: 08-03-17  Next AEX: not scheduled  Last MMG (if hormonal medication request): 05-03-17 WNL  Refill authorized: please advise

## 2017-09-10 ENCOUNTER — Other Ambulatory Visit: Payer: Self-pay | Admitting: *Deleted

## 2017-09-10 DIAGNOSIS — F419 Anxiety disorder, unspecified: Secondary | ICD-10-CM

## 2017-09-15 ENCOUNTER — Other Ambulatory Visit: Payer: Self-pay | Admitting: *Deleted

## 2017-09-15 DIAGNOSIS — F419 Anxiety disorder, unspecified: Secondary | ICD-10-CM

## 2017-09-24 HISTORY — PX: COLONOSCOPY WITH ESOPHAGOGASTRODUODENOSCOPY (EGD): SHX5779

## 2017-09-29 ENCOUNTER — Ambulatory Visit: Payer: BLUE CROSS/BLUE SHIELD | Admitting: Gastroenterology

## 2017-10-05 ENCOUNTER — Encounter: Payer: Self-pay | Admitting: Obstetrics and Gynecology

## 2017-10-28 ENCOUNTER — Telehealth: Payer: Self-pay | Admitting: Obstetrics and Gynecology

## 2017-10-28 NOTE — Telephone Encounter (Signed)
MADE IN ERROR. PLEASE DISREGARD.

## 2017-11-02 ENCOUNTER — Telehealth: Payer: Self-pay | Admitting: Obstetrics and Gynecology

## 2017-11-02 NOTE — Telephone Encounter (Signed)
I think we need to let Dr. Quincy Simmonds make a recommendation about this patient as she has some concerns about possible endometriosis.  Will route this to her as well.  Thanks.

## 2017-11-02 NOTE — Telephone Encounter (Signed)
Patient says she is still having the same symptoms and wondering if she need to follow up with Dr Quincy Simmonds after seeing Dr Collene Mares.

## 2017-11-02 NOTE — Telephone Encounter (Signed)
Call to patient, advised reviewed with covering provider, Dr. Quincy Simmonds to review and make recommendations. Will return call once reviewed. Patient verbalizes understanding and agreeable.

## 2017-11-02 NOTE — Telephone Encounter (Addendum)
Spoke with patient. States she f/u with Dr. Collene Mares, endoscopy and colonoscopy completed, 2 benign polyps removed, inflammation in stomach, stop all anti-inflammatories.   Patient states heavy bleeding and cramping with menses continues. LMP 9/9, has used 14 overnight pads to date. Blood is dark and menses started 1 wk late.   Patient states she can't take motrin and tylenol doesn't work. Is a smoker, has tried [employer], "made her feel strange".   Per review of last OV note dated 08/03/17, Provera possible option if irregular cycles continued. Advised Dr. Quincy Simmonds is out of the office, I will review with covering provider and return call.   Dr. Sabra Heck -please review and advise.   Cc: Dr. Quincy Simmonds

## 2017-11-03 NOTE — Telephone Encounter (Signed)
Please have the patient make an appointment to see me for reassessment.

## 2017-11-04 ENCOUNTER — Ambulatory Visit (INDEPENDENT_AMBULATORY_CARE_PROVIDER_SITE_OTHER): Payer: BLUE CROSS/BLUE SHIELD | Admitting: Obstetrics and Gynecology

## 2017-11-04 ENCOUNTER — Encounter: Payer: Self-pay | Admitting: Obstetrics and Gynecology

## 2017-11-04 VITALS — BP 116/66 | HR 80 | Temp 98.5°F | Resp 16 | Ht 60.0 in | Wt 194.0 lb

## 2017-11-04 DIAGNOSIS — R102 Pelvic and perineal pain: Secondary | ICD-10-CM | POA: Diagnosis not present

## 2017-11-04 DIAGNOSIS — Z113 Encounter for screening for infections with a predominantly sexual mode of transmission: Secondary | ICD-10-CM

## 2017-11-04 DIAGNOSIS — N926 Irregular menstruation, unspecified: Secondary | ICD-10-CM

## 2017-11-04 LAB — POCT URINE PREGNANCY: Preg Test, Ur: NEGATIVE

## 2017-11-04 NOTE — Progress Notes (Signed)
GYNECOLOGY  VISIT   HPI: 47 y.o.   Single  African American  female   [phone] with Patient's last menstrual period was 10/31/2017.   here for heavy and irregular bleeding.  Missed work for 2 days this week due to heavy and painful bleeding.  This cycle was 6 -7 days later.  Cycle lasted 15 days in July.  Cycle lasted 14 - 15 days in August.  Can also skip cycles.   Tired.  No SOB or lightheaded.   EMB - Benign proliferation of the endometrium.  Pelvic US - Uterine fibroids and normal ovaries.   Declines future childbearing.   She would like to complete STD testing for anything not already done.  Has gained weight and developed urinary incontinence related to this.  She declines surgical care for this.   Saw Dr. Collene Mares for bloating.  Had upper endoscopy and colonoscopy.  Dx with gastritis and polyps and reflux.  Told to avoid NSAIDs.   Has anxiety and tried Lexapro which caused diarrhea.  Declines referral to psychiatry.   Works for [employer].   GYNECOLOGIC HISTORY: Patient's last menstrual period was 10/31/2017. Contraception: tubal ligation Menopausal hormone therapy:  none Last mammogram:  04-27-17 Neg/BiRads1 Last pap smear:   2017 Negative per patient        OB History    Gravida  3   Para  2   Term  2   Preterm      AB  1   Living  2     SAB      TAB      Ectopic      Multiple      Live Births                 Patient Active Problem List   Diagnosis Date Noted  . Abdominal bloating 08/05/2017  . Anxiety 08/05/2017  . Stress incontinence 05/20/2017  . Fibroids 04/02/2017    Past Medical History:  Diagnosis Date  . Anxiety   . Dysmenorrhea   . Fibroid   . Gastritis 2019  . GERD (gastroesophageal reflux disease)   . Urinary incontinence     Past Surgical History:  Procedure Laterality Date  . COLONOSCOPY WITH ESOPHAGOGASTRODUODENOSCOPY (EGD)  09/24/2017  . TUBAL LIGATION      Current Outpatient Medications  Medication Sig  Dispense Refill  . diphenhydrAMINE HCl (BENADRYL ALLERGY PO) Take by mouth as needed.    . Escitalopram Oxalate (LEXAPRO PO) Take 10 mg by mouth every morning.     . famotidine (PEPCID AC) 10 MG chewable tablet Chew 10 mg by mouth 2 (two) times daily.    . fluticasone (FLONASE) 50 MCG/ACT nasal spray Place 2 sprays into both nostrils daily. 16 g 1  . Multiple Vitamins-Minerals (MULTIVITAMIN ADULT PO) Take by mouth.    . naphazoline-pheniramine (NAPHCON-A) 0.025-0.3 % ophthalmic solution Place 1 drop into both eyes 4 (four) times daily as needed for eye irritation. (Patient not taking: Reported on 11/04/2017) 15 mL 0   No current facility-administered medications for this visit.      ALLERGIES: Protonix [pantoprazole sodium]; Latex; and Penicillins  Family History  Problem Relation Age of Onset  . Thyroid disease Mother   . Lupus Mother   . Breast cancer Maternal Aunt   . Hypertension Maternal Grandmother     Social History   Socioeconomic History  . Marital status: Single    Spouse name: Not on file  . Number of children: Not on  file  . Years of education: Not on file  . Highest education level: Not on file  Occupational History  . Not on file  Social Needs  . Financial resource strain: Not on file  . Food insecurity:    Worry: Not on file    Inability: Not on file  . Transportation needs:    Medical: Not on file    Non-medical: Not on file  Tobacco Use  . Smoking status: Current Every Day Smoker    Packs/day: 1.00    Types: Cigarettes  . Smokeless tobacco: Never Used  Substance and Sexual Activity  . Alcohol use: Yes    Alcohol/week: 1.0 standard drinks    Types: 1 Glasses of wine per week    Comment: social  . Drug use: No  . Sexual activity: Yes    Birth control/protection: Surgical    Comment: tubal  Lifestyle  . Physical activity:    Days per week: Not on file    Minutes per session: Not on file  . Stress: Not on file  Relationships  . Social  connections:    Talks on phone: Not on file    Gets together: Not on file    Attends religious service: Not on file    Active member of club or organization: Not on file    Attends meetings of clubs or organizations: Not on file    Relationship status: Not on file  . Intimate partner violence:    Fear of current or ex partner: Not on file    Emotionally abused: Not on file    Physically abused: Not on file    Forced sexual activity: Not on file  Other Topics Concern  . Not on file  Social History Narrative  . Not on file    Review of Systems  Genitourinary:       Excess bleeding Painful periods Menstrual cycle changes Unscheduled bleeding or spotting  All other systems reviewed and are negative.   PHYSICAL EXAMINATION:    BP 116/66 (BP Location: Right Arm, Patient Position: Sitting, Cuff Size: Normal)   Pulse 80   Temp 98.5 F (36.9 C) (Oral)   Resp 16   Ht 5' (1.524 m)   Wt 194 lb (88 kg)   LMP 10/31/2017   BMI 37.89 kg/m     General appearance: alert, cooperative and appears stated age  Pelvic: External genitalia:  no lesions              Urethra:  normal appearing urethra with no masses, tenderness or lesions              Bartholins and Skenes: normal                 Vagina: normal appearing vagina with normal color and discharge, no lesions              Cervix: no lesions.  Vaginal bleeding noted.                Bimanual Exam:  Uterus:  8 week size and tender.              Adnexa: no mass, fullness, tenderness               Chaperone was present for exam.  ASSESSMENT  Status post BTL. Fibroids. Abnormal uterine bleeding. Probable anovulation.  Potential small endometrial mass and negative EMB. Intolerant to [employer]. Pelvic pain. Gastritis.  Pain when bladder is full. I think  it is possible the patient has endometriosis.  Stress incontinence.  PLAN  Hep B and C, CBC.  Discussed laparoscopic hysterectomy with bilateral salpingectomy and  possible bilateral oophorectomy.  I reviewed risks, benefits, and alternatives.  Risks include but are not limited to bleeding, infection, damage to surrounding organs, pneumonia, reaction to anesthesia, DVT, PE, death, need for reoperation, hernia formation, need to convert to a traditional laparotomy incision to complete the procedure, and menopausal symptoms if ovaries are removed.  She wishes to proceed. ACOG HO on hysterectomy to patient.  She declines surgical care for urinary incontinence.  Will need to avoid NSAIDs post op.   An After Visit Summary was printed and given to the patient.  _25_____ minutes face to face time of which over 50% was spent in counseling.

## 2017-11-04 NOTE — Telephone Encounter (Signed)
Spoke with patient. Advised per Dr. Quincy Simmonds. Patient reports bleeding has lightened to "moderate", cramping has improved. Denies SOB, weakness, fatigue or lightheadedness.   OV scheduled for 9/30 at 3pm with Dr. Quincy Simmonds. Recommended earlier OV with Dr. Quincy Simmonds, patient declines earlier OV. Patient has additional questions for business office, message forwarded to buisness office for return call, see account notes.

## 2017-11-04 NOTE — Telephone Encounter (Signed)
Call returned to patient, OV rescheduled for today at 3pm with Dr. Quincy Simmonds.   Encounter closed.

## 2017-11-05 LAB — HEPATITIS C ANTIBODY

## 2017-11-05 LAB — CBC
Hematocrit: 35.9 % (ref 34.0–46.6)
Hemoglobin: 11.2 g/dL (ref 11.1–15.9)
MCH: 27.3 pg (ref 26.6–33.0)
MCHC: 31.2 g/dL — AB (ref 31.5–35.7)
MCV: 88 fL (ref 79–97)
Platelets: 268 10*3/uL (ref 150–450)
RBC: 4.1 x10E6/uL (ref 3.77–5.28)
RDW: 15.2 % (ref 12.3–15.4)
WBC: 8.5 10*3/uL (ref 3.4–10.8)

## 2017-11-05 LAB — HEPATITIS B SURFACE ANTIGEN: Hepatitis B Surface Ag: NEGATIVE

## 2017-11-06 DIAGNOSIS — R102 Pelvic and perineal pain: Secondary | ICD-10-CM | POA: Insufficient documentation

## 2017-11-08 ENCOUNTER — Telehealth: Payer: Self-pay | Admitting: Obstetrics and Gynecology

## 2017-11-08 NOTE — Telephone Encounter (Signed)
Spoke with patient in regards to benefits for surgery. Patient understood benefit information presented. Patient was not aware of benefits. Patient states "it will be a minute before I can do this". Patient asked if there is any other treatment options. Forwarding to Nurse Supervisor to review.  Routing to Lamont Snowball, RN

## 2017-11-15 NOTE — Telephone Encounter (Signed)
Call to patient. States she is unable to proceed with surgery at this time.  Consult discuss alternative options scheduled for 11-26-17 at 230 with Dr Quincy Simmonds.   Routing to provider for final review. Patient agreeable to disposition. Will close encounter.

## 2017-11-22 ENCOUNTER — Ambulatory Visit: Payer: Self-pay | Admitting: Obstetrics and Gynecology

## 2017-11-25 NOTE — Progress Notes (Signed)
GYNECOLOGY  VISIT   HPI: 47 y.o.   Single  African American  female   [phone] with Patient's last menstrual period was 10/31/2017.   here for surgery consult.    Taking a probiotic and now is feeling much better.  Has taken this for 4 days.  No constipation and or pain in her stomach.  She has good energy.  Also taking Psyllium and so no longer feels bloated.  She is very pleased with these changes.  Had endoscopy and colonoscopy with Dr. Collene Mares, and she has gastritis.   No menses yet this month.  Thinks she is due any time.  September menses was 9/8 - 9/16.  August - 8/1 - 8/6.  June - 6/29 - 7/7. May - 5/17 and lasted 15 days.  Skipped menses from December to April.  GYNECOLOGIC HISTORY: Patient's last menstrual period was 10/31/2017. Contraception:  Tubal ligation Menopausal hormone therapy:  none Last mammogram:  04/27/2017 BI-RADS CATEGORY  1: Negative. Last pap smear:   2017 Negative per patient        OB History    Gravida  3   Para  2   Term  2   Preterm      AB  1   Living  2     SAB      TAB      Ectopic      Multiple      Live Births                 Patient Active Problem List   Diagnosis Date Noted  . Pelvic pain 11/06/2017  . Abdominal bloating 08/05/2017  . Anxiety 08/05/2017  . Stress incontinence 05/20/2017  . Fibroids 04/02/2017    Past Medical History:  Diagnosis Date  . Anxiety   . Dysmenorrhea   . Fibroid   . Gastritis 2019  . GERD (gastroesophageal reflux disease)   . Urinary incontinence     Past Surgical History:  Procedure Laterality Date  . COLONOSCOPY WITH ESOPHAGOGASTRODUODENOSCOPY (EGD)  09/24/2017  . TUBAL LIGATION      Current Outpatient Medications  Medication Sig Dispense Refill  . diphenhydrAMINE HCl (BENADRYL ALLERGY PO) Take by mouth as needed.    . Escitalopram Oxalate (LEXAPRO PO) Take 10 mg by mouth every morning.     . famotidine (PEPCID AC) 10 MG chewable tablet Chew 10 mg by mouth 2 (two)  times daily.    Marland Kitchen FIBER PO Take by mouth 3 (three) times daily.    . fluticasone (FLONASE) 50 MCG/ACT nasal spray Place 2 sprays into both nostrils daily. 16 g 1  . Multiple Vitamins-Minerals (MULTIVITAMIN ADULT PO) Take by mouth.    . naphazoline-pheniramine (NAPHCON-A) 0.025-0.3 % ophthalmic solution Place 1 drop into both eyes 4 (four) times daily as needed for eye irritation. 15 mL 0  . Probiotic Product (PROBIOTIC PO) Take by mouth daily.     No current facility-administered medications for this visit.      ALLERGIES: Protonix [pantoprazole sodium]; Latex; and Penicillins  Family History  Problem Relation Age of Onset  . Thyroid disease Mother   . Lupus Mother   . Breast cancer Maternal Aunt   . Hypertension Maternal Grandmother     Social History   Socioeconomic History  . Marital status: Single    Spouse name: Not on file  . Number of children: Not on file  . Years of education: Not on file  . Highest education level: Not on  file  Occupational History  . Not on file  Social Needs  . Financial resource strain: Not on file  . Food insecurity:    Worry: Not on file    Inability: Not on file  . Transportation needs:    Medical: Not on file    Non-medical: Not on file  Tobacco Use  . Smoking status: Current Every Day Smoker    Packs/day: 1.00    Types: Cigarettes  . Smokeless tobacco: Never Used  Substance and Sexual Activity  . Alcohol use: Yes    Alcohol/week: 1.0 standard drinks    Types: 1 Glasses of wine per week    Comment: social  . Drug use: No  . Sexual activity: Yes    Birth control/protection: Surgical    Comment: tubal  Lifestyle  . Physical activity:    Days per week: Not on file    Minutes per session: Not on file  . Stress: Not on file  Relationships  . Social connections:    Talks on phone: Not on file    Gets together: Not on file    Attends religious service: Not on file    Active member of club or organization: Not on file    Attends  meetings of clubs or organizations: Not on file    Relationship status: Not on file  . Intimate partner violence:    Fear of current or ex partner: Not on file    Emotionally abused: Not on file    Physically abused: Not on file    Forced sexual activity: Not on file  Other Topics Concern  . Not on file  Social History Narrative  . Not on file    Review of Systems  Constitutional: Negative.   HENT: Negative.   Eyes: Negative.   Respiratory: Negative.   Cardiovascular: Negative.   Gastrointestinal: Negative.   Endocrine: Negative.   Genitourinary: Negative.   Musculoskeletal: Negative.   Skin: Negative.   Allergic/Immunologic: Negative.   Neurological: Negative.   Hematological: Negative.   Psychiatric/Behavioral: Negative.   All other systems reviewed and are negative.   PHYSICAL EXAMINATION:    Ht 5' 0.75" (1.543 m)   Wt 194 lb (88 kg)   LMP 10/31/2017   BMI 36.96 kg/m     General appearance: alert, cooperative and appears stated age  ASSESSMENT  Gastritis.  Abdominal pain and bloating.  Resolved with probiotics and Psyllium. Fibroids.  Normalization of menses.   PLAN  Avoid NSAIDS, ETOH, carbonated beverages, spicey or citrus foods/beverages. Mirena IUD, [place], and Depo Lupron discussed with patient if painful menses return. May be a candidate for Mirena in future if needed.  Continue probiotics and fiber.  FU in one month.  No surgery recommended at this time.   An After Visit Summary was printed and given to the patient.  __15____ minutes face to face time of which over 50% was spent in counseling.

## 2017-11-26 ENCOUNTER — Encounter: Payer: Self-pay | Admitting: Obstetrics and Gynecology

## 2017-11-26 ENCOUNTER — Ambulatory Visit (INDEPENDENT_AMBULATORY_CARE_PROVIDER_SITE_OTHER): Payer: BLUE CROSS/BLUE SHIELD | Admitting: Obstetrics and Gynecology

## 2017-11-26 ENCOUNTER — Other Ambulatory Visit: Payer: Self-pay

## 2017-11-26 VITALS — BP 112/78 | HR 87 | Ht 60.75 in | Wt 194.0 lb

## 2017-11-26 DIAGNOSIS — R109 Unspecified abdominal pain: Secondary | ICD-10-CM | POA: Diagnosis not present

## 2017-11-26 DIAGNOSIS — Z5181 Encounter for therapeutic drug level monitoring: Secondary | ICD-10-CM | POA: Diagnosis not present

## 2017-11-26 DIAGNOSIS — R140 Abdominal distension (gaseous): Secondary | ICD-10-CM | POA: Diagnosis not present

## 2017-12-21 ENCOUNTER — Other Ambulatory Visit: Payer: Self-pay

## 2017-12-21 ENCOUNTER — Emergency Department (HOSPITAL_BASED_OUTPATIENT_CLINIC_OR_DEPARTMENT_OTHER)
Admission: EM | Admit: 2017-12-21 | Discharge: 2017-12-21 | Disposition: A | Discharge status: Home / Self Care | Payer: BLUE CROSS/BLUE SHIELD | Attending: Emergency Medicine | Admitting: Emergency Medicine

## 2017-12-21 ENCOUNTER — Encounter (HOSPITAL_BASED_OUTPATIENT_CLINIC_OR_DEPARTMENT_OTHER): Payer: Self-pay | Admitting: Emergency Medicine

## 2017-12-21 ENCOUNTER — Emergency Department (HOSPITAL_BASED_OUTPATIENT_CLINIC_OR_DEPARTMENT_OTHER): Payer: BLUE CROSS/BLUE SHIELD

## 2017-12-21 DIAGNOSIS — F419 Anxiety disorder, unspecified: Secondary | ICD-10-CM | POA: Insufficient documentation

## 2017-12-21 DIAGNOSIS — Z79899 Other long term (current) drug therapy: Secondary | ICD-10-CM | POA: Insufficient documentation

## 2017-12-21 DIAGNOSIS — Z91040 Latex allergy status: Secondary | ICD-10-CM | POA: Insufficient documentation

## 2017-12-21 DIAGNOSIS — F17210 Nicotine dependence, cigarettes, uncomplicated: Secondary | ICD-10-CM | POA: Diagnosis not present

## 2017-12-21 DIAGNOSIS — J069 Acute upper respiratory infection, unspecified: Secondary | ICD-10-CM | POA: Insufficient documentation

## 2017-12-21 DIAGNOSIS — B9789 Other viral agents as the cause of diseases classified elsewhere: Secondary | ICD-10-CM

## 2017-12-21 DIAGNOSIS — R05 Cough: Secondary | ICD-10-CM | POA: Diagnosis present

## 2017-12-21 MED ORDER — ALBUTEROL SULFATE 108 (90 BASE) MCG/ACT IN AEPB: 2.0000 | INHALATION_SPRAY | RESPIRATORY_TRACT | 0 refills | Status: DC

## 2017-12-21 MED ORDER — HYDROCODONE-HOMATROPINE 5-1.5 MG/5ML PO SYRP: 5.0000 mL | ORAL_SOLUTION | Freq: Four times a day (QID) | ORAL | 0 refills | Status: DC | PRN

## 2017-12-21 MED ORDER — FLUTICASONE PROPIONATE 50 MCG/ACT NA SUSP: 1.0000 | Freq: Every day | NASAL | 0 refills | Status: AC

## 2017-12-21 NOTE — ED Provider Notes (Signed)
[facility] Provider Note   CSN: [id number] Arrival date & time: 12/21/17  1614     History   Chief Complaint Chief Complaint  Patient presents with  . Cough    HPI Ruth Kennedy is a 47 y.o. female with history of tobacco use, bronchitis is here for evaluation of cough.  Onset this morning.  Dry but intermittently productive of clear sputum.  Reports 2 to 3 days ago she developed associated nasal congestion, postnasal drip, scratchy throat, sneezing.  She has taken Sudafed, Mucinex and over-the-counter anti-inflammatory without relief of the cough.  Symptoms are worse at night.  No alleviating factors.  No sick contacts.  She does not get the flu shot.  States the cough is disruptive during the day and her coworkers are bothered by it.  She denies any fevers, chills, chest pain, shortness of breath, wheezing, nausea, vomiting, abdominal pain, diarrhea.  HPI  Past Medical History:  Diagnosis Date  . Anxiety   . Dysmenorrhea   . Fibroid   . Gastritis 2019  . GERD (gastroesophageal reflux disease)   . Urinary incontinence     Patient Active Problem List   Diagnosis Date Noted  . Pelvic pain 11/06/2017  . Anxiety 08/05/2017  . Stress incontinence 05/20/2017  . Fibroids 04/02/2017    Past Surgical History:  Procedure Laterality Date  . COLONOSCOPY WITH ESOPHAGOGASTRODUODENOSCOPY (EGD)  09/24/2017  . TUBAL LIGATION       OB History    Gravida  3   Para  2   Term  2   Preterm      AB  1   Living  2     SAB      TAB      Ectopic      Multiple      Live Births               Home Medications    Prior to Admission medications   Medication Sig Start Date End Date Taking? Authorizing Provider  Albuterol Sulfate (PROAIR RESPICLICK) 941 (90 Base) MCG/ACT AEPB Inhale 2 puffs into the lungs every 4 (four) hours. 12/21/17   Kinnie Feil, PA-C  diphenhydrAMINE HCl (BENADRYL ALLERGY PO) Take by mouth as needed.     [provider]  Escitalopram Oxalate (LEXAPRO PO) Take 10 mg by mouth every morning.     [provider]  famotidine (PEPCID AC) 10 MG chewable tablet Chew 10 mg by mouth 2 (two) times daily.    [provider]  FIBER PO Take by mouth 3 (three) times daily.    [provider]  fluticasone (FLONASE) 50 MCG/ACT nasal spray Place 2 sprays into both nostrils daily. 04/16/17   Gareth Morgan, MD  fluticasone (FLONASE) 50 MCG/ACT nasal spray Place 1 spray into both nostrils daily. 12/21/17   Kinnie Feil, PA-C  HYDROcodone-homatropine (HYCODAN) 5-1.5 MG/5ML syrup Take 5 mLs by mouth every 6 (six) hours as needed for cough. FOR DISRUPTIVE NIGHT TIME COUGH AND BODY ACHES 12/21/17   Kinnie Feil, PA-C  Multiple Vitamins-Minerals (MULTIVITAMIN ADULT PO) Take by mouth.    [provider]  naphazoline-pheniramine (NAPHCON-A) 0.025-0.3 % ophthalmic solution Place 1 drop into both eyes 4 (four) times daily as needed for eye irritation. 04/16/17   Gareth Morgan, MD  Probiotic Product (PROBIOTIC PO) Take by mouth daily.    [provider]    Family History Family History  Problem Relation Age of Onset  .  Thyroid disease Mother   . Lupus Mother   . Breast cancer Maternal Aunt   . Hypertension Maternal Grandmother     Social History Social History   Tobacco Use  . Smoking status: Current Every Day Smoker    Packs/day: 1.00    Types: Cigarettes  . Smokeless tobacco: Never Used  Substance Use Topics  . Alcohol use: Yes    Alcohol/week: 1.0 standard drinks    Types: 1 Glasses of wine per week    Comment: social  . Drug use: No     Allergies   Protonix [pantoprazole sodium]; Latex; and Penicillins   Review of Systems Review of Systems  HENT: Positive for congestion, postnasal drip, rhinorrhea and sore throat.   Respiratory: Positive for cough.   All other systems reviewed and are negative.    Physical Exam Updated  Vital Signs BP 127/87 (BP Location: Left Arm)   Pulse 87   Temp 99.4 F (37.4 C) (Oral)   Resp 18   Ht 5' (1.524 m)   Wt 87.1 kg   SpO2 100%   BMI 37.50 kg/m   Physical Exam  Constitutional: She is oriented to person, place, and time. She appears well-developed and well-nourished. No distress.  NAD.  HENT:  Head: Normocephalic and atraumatic.  Right Ear: External ear normal.  Left Ear: External ear normal.  Nose: Nose normal.  Sounds congested.  Moderate mucosal edema bilaterally with erythema and rhinorrhea.  Septum midline.  Oropharynx and soft palate slightly erythematous without edema, petechia, tonsillar hypertrophy or exudates.  Normal protrusion of tongue, normal phonation, moist mucous membranes.  No hot potato voice, no pulling oral secretions. TMs normal.  Eyes: Conjunctivae and EOM are normal. No scleral icterus.  Neck: Normal range of motion. Neck supple.    No cervical lymphadenopathy.  Cardiovascular: Normal rate, regular rhythm and normal heart sounds.  No murmur heard. Pulmonary/Chest: Effort normal and breath sounds normal. She has no wheezes.  Normal work of breathing.  Musculoskeletal: Normal range of motion. She exhibits no deformity.  Neurological: She is alert and oriented to person, place, and time.  Skin: Skin is warm and dry. Capillary refill takes less than 2 seconds.  Psychiatric: She has a normal mood and affect. Her behavior is normal. Judgment and thought content normal.  Nursing note and vitals reviewed.    ED Treatments / Results  Labs (all labs ordered are listed, but only abnormal results are displayed) Labs Reviewed - No data to display  EKG None  Radiology Dg Chest 2 View  Result Date: 12/21/2017 CLINICAL DATA:  Cough for several days with low-grade fever EXAM: CHEST - 2 VIEW COMPARISON:  11/30/2016 FINDINGS: The heart size and mediastinal contours are within normal limits. Both lungs are clear. The visualized skeletal structures  are unremarkable. IMPRESSION: No active cardiopulmonary disease. Electronically Signed   By: Inez Catalina M.D.   On: 12/21/2017 16:52    Procedures Procedures (including critical care time)  Medications Ordered in ED Medications - No data to display   Initial Impression / Assessment and Plan / ED Course  I have reviewed the triage vital signs and the nursing notes.  Pertinent labs & imaging results that were available during my care of the patient were reviewed by me and considered in my medical decision making (see chart for details).     47 y.o. -year-old female presents with URI like symptoms and cough. On my exam patient is nontoxic appearing, speaking in full sentences, w/o  increased WOB. No fever, tachypnea, tachycardia, hypoxia. Lungs are CTAB. I do not think that a CXR is indicated at this time as VS are WNL, there are no signs of consolidation on auscultation and there is no hypoxia. No significant h/o immunocompromise. Doubt bacterial bronchitis or pneumonia.  Given reassuring physical exam, will discharge with symptomatic treatment. Strict ED return precautions given. Patient is aware that a viral URI infection may precede pneumonia or worsening illness. Patient is aware of red flag symptoms to monitor for that would warrant return to the ED for further reevaluation.   Final Clinical Impressions(s) / ED Diagnoses   Final diagnoses:  Viral URI with cough    ED Discharge Orders         Ordered    HYDROcodone-homatropine (HYCODAN) 5-1.5 MG/5ML syrup  Every 6 hours PRN     12/21/17 1724    fluticasone (FLONASE) 50 MCG/ACT nasal spray  Daily     12/21/17 1724    Albuterol Sulfate (PROAIR RESPICLICK) 009 (90 Base) MCG/ACT AEPB  Every 4 hours     12/21/17 1724           Kinnie Feil, PA-C 12/21/17 1821    Little, Wenda Overland, MD 12/21/17 2356

## 2017-12-21 NOTE — Discharge Instructions (Addendum)
Your symptoms are most likely from a virus that has caused an upper respiratory infection. Your chest x-ray is negative. A viral illness typically peaks on day 2-3 and resolves after one week.  You may have a lingering cough for up to 4 weeks.   The main treatment approach for a viral upper respiratory infection is to treat the symptoms, support your immune system and prevent spread of illness.   I have given you a prescription for hycodan syrup you can take for cough and chest wall pain.  Use albuterol inhaler for cough and wheezing and flonase for nasal congestion.   Stay well-hydrated. Rest. You can use over the counter medications to help with symptoms: 600 mg ibuprofen (motrin, aleve, advil) or acetaminophen (tylenol) every 6 hours, around the clock to help with associated fevers, sore throat, headaches, generalized body aches and malaise.  Oxymetazoline (afrin) intranasal spray once daily for no more than 3 days to help with congestion, after 3 days you can switch to another over-the-counter nasal steroid spray such as fluticasone (flonase) Allergy medication (loratadine, cetirizine, etc) and phenylephrine (sudafed) help with nasal congestion, runny nose and postnasal drip.   Dextromethorphan (Delsym) to suppress cough Guaifenesin (mucinex) to help with built up mucus in chest and productive cough Wash your hands often to prevent spread.   A viral upper respiratory infection can also worsen and progress into pneumonia. Return if you develop persistent fevers, chest pain, shortness of breath, increased work of breathing, productive cough

## 2017-12-21 NOTE — ED Triage Notes (Signed)
Cough for 3 days.

## 2017-12-26 ENCOUNTER — Encounter (HOSPITAL_BASED_OUTPATIENT_CLINIC_OR_DEPARTMENT_OTHER): Payer: Self-pay | Admitting: *Deleted

## 2017-12-26 ENCOUNTER — Other Ambulatory Visit: Payer: Self-pay

## 2017-12-26 ENCOUNTER — Emergency Department (HOSPITAL_BASED_OUTPATIENT_CLINIC_OR_DEPARTMENT_OTHER)
Admission: EM | Admit: 2017-12-26 | Discharge: 2017-12-26 | Disposition: A | Discharge status: Home / Self Care | Payer: BLUE CROSS/BLUE SHIELD | Attending: Emergency Medicine | Admitting: Emergency Medicine

## 2017-12-26 DIAGNOSIS — F17210 Nicotine dependence, cigarettes, uncomplicated: Secondary | ICD-10-CM | POA: Insufficient documentation

## 2017-12-26 DIAGNOSIS — J209 Acute bronchitis, unspecified: Secondary | ICD-10-CM | POA: Diagnosis not present

## 2017-12-26 DIAGNOSIS — F419 Anxiety disorder, unspecified: Secondary | ICD-10-CM | POA: Diagnosis not present

## 2017-12-26 DIAGNOSIS — F17200 Nicotine dependence, unspecified, uncomplicated: Secondary | ICD-10-CM

## 2017-12-26 DIAGNOSIS — Z79899 Other long term (current) drug therapy: Secondary | ICD-10-CM | POA: Diagnosis not present

## 2017-12-26 DIAGNOSIS — R05 Cough: Secondary | ICD-10-CM | POA: Diagnosis present

## 2017-12-26 MED ORDER — AZITHROMYCIN 250 MG PO TABS
500.0000 mg | ORAL_TABLET | Freq: Once | ORAL | Status: AC
  Administered 2017-12-26: 500 mg via ORAL
  Filled 2017-12-26: qty 2

## 2017-12-26 MED ORDER — AZITHROMYCIN 250 MG PO TABS: 250.0000 mg | ORAL_TABLET | Freq: Every day | ORAL | 0 refills | Status: DC

## 2017-12-26 NOTE — ED Notes (Signed)
Pt states she was here several days ago and "told her to give her an antibiotic." Pt is back and states it is in her throat. States "cant you give a shot or something". RN informed her that he was the nurse and the provider would be in after her. "Well tell them to give me a shot". RN informed patient that she could talk with the provider when they can in to evaluate her. NAD noted

## 2017-12-26 NOTE — ED Triage Notes (Signed)
Pt states she was seen here this week for same URI sx and cough but didn't receive an antibiotic and now she is worse

## 2017-12-26 NOTE — ED Provider Notes (Signed)
[facility] Provider Note   CSN: [id number] Arrival date & time: 12/26/17  2145     History   Chief Complaint Chief Complaint  Patient presents with  . Cough    HPI Ruth Kennedy is a 47 y.o. female.  HPI Patient reports that she was seen last week for cough.  She reports her cough is gotten worse.  She reports she is got laryngitis.  She has not developed fever.  She does smoke.  Does not have chest pain.no  Lower extremity swelling.  Patient feels that her symptoms have worsened and that she needs an antibiotic. Past Medical History:  Diagnosis Date  . Anxiety   . Dysmenorrhea   . Fibroid   . Gastritis 2019  . GERD (gastroesophageal reflux disease)   . Urinary incontinence     Patient Active Problem List   Diagnosis Date Noted  . Pelvic pain 11/06/2017  . Anxiety 08/05/2017  . Stress incontinence 05/20/2017  . Fibroids 04/02/2017    Past Surgical History:  Procedure Laterality Date  . COLONOSCOPY WITH ESOPHAGOGASTRODUODENOSCOPY (EGD)  09/24/2017  . TUBAL LIGATION       OB History    Gravida  3   Para  2   Term  2   Preterm      AB  1   Living  2     SAB      TAB      Ectopic      Multiple      Live Births               Home Medications    Prior to Admission medications   Medication Sig Start Date End Date Taking? Authorizing Provider  Albuterol Sulfate (PROAIR RESPICLICK) 951 (90 Base) MCG/ACT AEPB Inhale 2 puffs into the lungs every 4 (four) hours. 12/21/17   Kinnie Feil, PA-C  azithromycin (ZITHROMAX) 250 MG tablet Take 1 tablet (250 mg total) by mouth daily. 12/26/17   Charlesetta Shanks, MD  diphenhydrAMINE HCl (BENADRYL ALLERGY PO) Take by mouth as needed.    [provider]  Escitalopram Oxalate (LEXAPRO PO) Take 10 mg by mouth every morning.     [provider]  famotidine (PEPCID AC) 10 MG chewable tablet Chew 10 mg by mouth 2 (two) times daily.    [provider]  FIBER PO Take by mouth 3 (three) times daily.    [provider]  fluticasone (FLONASE) 50 MCG/ACT nasal spray Place 2 sprays into both nostrils daily. 04/16/17   Gareth Morgan, MD  fluticasone (FLONASE) 50 MCG/ACT nasal spray Place 1 spray into both nostrils daily. 12/21/17   Kinnie Feil, PA-C  HYDROcodone-homatropine (HYCODAN) 5-1.5 MG/5ML syrup Take 5 mLs by mouth every 6 (six) hours as needed for cough. FOR DISRUPTIVE NIGHT TIME COUGH AND BODY ACHES 12/21/17   Kinnie Feil, PA-C  Multiple Vitamins-Minerals (MULTIVITAMIN ADULT PO) Take by mouth.    [provider]  naphazoline-pheniramine (NAPHCON-A) 0.025-0.3 % ophthalmic solution Place 1 drop into both eyes 4 (four) times daily as needed for eye irritation. 04/16/17   Gareth Morgan, MD  Probiotic Product (PROBIOTIC PO) Take by mouth daily.    [provider]    Family History Family History  Problem Relation Age of Onset  . Thyroid disease Mother   . Lupus Mother   . Breast cancer Maternal Aunt   . Hypertension Maternal Grandmother     Social History Social History  Tobacco Use  . Smoking status: Current Every Day Smoker    Packs/day: 1.00    Types: Cigarettes  . Smokeless tobacco: Never Used  Substance Use Topics  . Alcohol use: Yes    Alcohol/week: 1.0 standard drinks    Types: 1 Glasses of wine per week    Comment: social  . Drug use: No     Allergies   Protonix [pantoprazole sodium]; Latex; and Penicillins   Review of Systems Review of Systems 10 Systems reviewed and are negative for acute change except as noted in the HPI.   Physical Exam Updated Vital Signs BP (!) 161/89 (BP Location: Left Arm)   Pulse (!) 104   Temp 99.3 F (37.4 C) (Oral)   Resp 20   Ht 5' (1.524 m)   Wt 87 kg   SpO2 100%   BMI 37.46 kg/m   Physical Exam  Constitutional: She is oriented to person, place, and time. She appears well-developed and well-nourished. No distress.  HENT:   Head: Normocephalic and atraumatic.  Nose: Nose normal.  Mouth/Throat: Oropharynx is clear and moist.  Eyes: EOM are normal.  Neck: Neck supple.  Cardiovascular: Normal rate, regular rhythm, normal heart sounds and intact distal pulses.  Pulmonary/Chest:  No respiratory distress.  Harsh paroxysmal cough.  Lungs are clear to auscultation.  No stridor.  Abdominal: Soft. She exhibits no distension. There is no tenderness. There is no guarding.  Musculoskeletal: Normal range of motion. She exhibits no edema or tenderness.  Neurological: She is alert and oriented to person, place, and time. No cranial nerve deficit. She exhibits normal muscle tone. Coordination normal.  Skin: Skin is warm and dry.  Psychiatric: She has a normal mood and affect.     ED Treatments / Results  Labs (all labs ordered are listed, but only abnormal results are displayed) Labs Reviewed - No data to display  EKG None  Radiology No results found.  Procedures Procedures (including critical care time)  Medications Ordered in ED Medications  azithromycin (ZITHROMAX) tablet 500 mg (has no administration in time range)     Initial Impression / Assessment and Plan / ED Course  I have reviewed the triage vital signs and the nursing notes.  Pertinent labs & imaging results that were available during my care of the patient were reviewed by me and considered in my medical decision making (see chart for details).    Patient is a tobacco user with 1 week of cough.  She reports is getting worse with increasing congestion.  Rest x-ray done 1 week ago did not show pneumonia.  Patient has not developed fever or chest pain.  Will provide Z-Pak.  Patient will continue inhaler as prescribed.  Patient instructed to follow-up with PCP this week.  Final Clinical Impressions(s) / ED Diagnoses   Final diagnoses:  Acute bronchitis, unspecified organism  Smoker    ED Discharge Orders         Ordered    azithromycin  (ZITHROMAX) 250 MG tablet  Daily     12/26/17 2257           Charlesetta Shanks, MD 12/26/17 2302

## 2017-12-26 NOTE — ED Notes (Signed)
Pt coughing constantly in triage--yelling "can you get this out of my throat". Verbalizes that she wishes to complain because the doctor who saw her last week did not give her an antibiotic like she wanted. No respiratory distress noted.

## 2017-12-27 ENCOUNTER — Ambulatory Visit (INDEPENDENT_AMBULATORY_CARE_PROVIDER_SITE_OTHER): Payer: BLUE CROSS/BLUE SHIELD | Admitting: Obstetrics and Gynecology

## 2017-12-27 ENCOUNTER — Other Ambulatory Visit: Payer: Self-pay

## 2017-12-27 ENCOUNTER — Encounter: Payer: Self-pay | Admitting: Obstetrics and Gynecology

## 2017-12-27 VITALS — BP 142/80 | HR 76 | Ht 60.0 in | Wt 194.8 lb

## 2017-12-27 DIAGNOSIS — R300 Dysuria: Secondary | ICD-10-CM | POA: Diagnosis not present

## 2017-12-27 DIAGNOSIS — N760 Acute vaginitis: Secondary | ICD-10-CM

## 2017-12-27 LAB — POCT URINALYSIS DIPSTICK
Bilirubin, UA: NEGATIVE
GLUCOSE UA: NEGATIVE
KETONES UA: NEGATIVE
LEUKOCYTES UA: NEGATIVE
Nitrite, UA: NEGATIVE
Protein, UA: NEGATIVE
RBC UA: NEGATIVE
Urobilinogen, UA: 0.2 E.U./dL
pH, UA: 5 (ref 5.0–8.0)

## 2017-12-27 NOTE — Patient Instructions (Signed)

## 2017-12-27 NOTE — Progress Notes (Signed)
GYNECOLOGY  VISIT   HPI: 47 y.o.   Single  African American  female   [phone] with Patient's last menstrual period was 12/02/2017 (exact date).   here for 4 week follow up.   Her LMP was about 5 days.  Satisfied with her menstrual cycles.   Some current constipation.  Bloating improved with her probiotics.  Patient recently treated in ER for bronchitis. She feels she may have a UTI now. She is complaining of frequency/dysuria--leaks urine with coughing and notices an odor.  Taking a Z pack.   Cutting down on her smoking.   Urine Dip: Neg   GYNECOLOGIC HISTORY: Patient's last menstrual period was 12/02/2017 (exact date). Contraception: tubal Menopausal hormone therapy: none Last mammogram:  04/27/2017 BI-RADS CATEGORY 1: Negative. Last pap smear: 2017 neg per patient        OB History    Gravida  3   Para  2   Term  2   Preterm      AB  1   Living  2     SAB      TAB      Ectopic      Multiple      Live Births                 Patient Active Problem List   Diagnosis Date Noted  . Pelvic pain 11/06/2017  . Anxiety 08/05/2017  . Stress incontinence 05/20/2017  . Fibroids 04/02/2017    Past Medical History:  Diagnosis Date  . Anxiety   . Dysmenorrhea   . Fibroid   . Gastritis 2019  . GERD (gastroesophageal reflux disease)   . Urinary incontinence     Past Surgical History:  Procedure Laterality Date  . COLONOSCOPY WITH ESOPHAGOGASTRODUODENOSCOPY (EGD)  09/24/2017  . TUBAL LIGATION      Current Outpatient Medications  Medication Sig Dispense Refill  . Albuterol Sulfate (PROAIR RESPICLICK) 295 (90 Base) MCG/ACT AEPB Inhale 2 puffs into the lungs every 4 (four) hours. 1 each 0  . azithromycin (ZITHROMAX) 250 MG tablet Take 1 tablet (250 mg total) by mouth daily. 4 tablet 0  . diphenhydrAMINE HCl (BENADRYL ALLERGY PO) Take by mouth as needed.    . Escitalopram Oxalate (LEXAPRO PO) Take 10 mg by mouth every morning.     . famotidine  (PEPCID AC) 10 MG chewable tablet Chew 10 mg by mouth 2 (two) times daily.    Marland Kitchen FIBER PO Take by mouth 3 (three) times daily.    . fluticasone (FLONASE) 50 MCG/ACT nasal spray Place 2 sprays into both nostrils daily. 16 g 1  . fluticasone (FLONASE) 50 MCG/ACT nasal spray Place 1 spray into both nostrils daily. 16 g 0  . HYDROcodone-homatropine (HYCODAN) 5-1.5 MG/5ML syrup Take 5 mLs by mouth every 6 (six) hours as needed for cough. FOR DISRUPTIVE NIGHT TIME COUGH AND BODY ACHES 120 mL 0  . Multiple Vitamins-Minerals (MULTIVITAMIN ADULT PO) Take by mouth.    . naphazoline-pheniramine (NAPHCON-A) 0.025-0.3 % ophthalmic solution Place 1 drop into both eyes 4 (four) times daily as needed for eye irritation. 15 mL 0  . Probiotic Product (PROBIOTIC PO) Take by mouth daily.     No current facility-administered medications for this visit.      ALLERGIES: Protonix [pantoprazole sodium]; Latex; and Penicillins  Family History  Problem Relation Age of Onset  . Thyroid disease Mother   . Lupus Mother   . Breast cancer Maternal Aunt   . Hypertension  Maternal Grandmother     Social History   Socioeconomic History  . Marital status: Single    Spouse name: Not on file  . Number of children: Not on file  . Years of education: Not on file  . Highest education level: Not on file  Occupational History  . Not on file  Social Needs  . Financial resource strain: Not on file  . Food insecurity:    Worry: Not on file    Inability: Not on file  . Transportation needs:    Medical: Not on file    Non-medical: Not on file  Tobacco Use  . Smoking status: Current Every Day Smoker    Packs/day: 1.00    Types: Cigarettes  . Smokeless tobacco: Never Used  Substance and Sexual Activity  . Alcohol use: Yes    Alcohol/week: 1.0 standard drinks    Types: 1 Glasses of wine per week    Comment: social  . Drug use: No  . Sexual activity: Yes    Birth control/protection: Surgical    Comment: tubal   Lifestyle  . Physical activity:    Days per week: Not on file    Minutes per session: Not on file  . Stress: Not on file  Relationships  . Social connections:    Talks on phone: Not on file    Gets together: Not on file    Attends religious service: Not on file    Active member of club or organization: Not on file    Attends meetings of clubs or organizations: Not on file    Relationship status: Not on file  . Intimate partner violence:    Fear of current or ex partner: Not on file    Emotionally abused: Not on file    Physically abused: Not on file    Forced sexual activity: Not on file  Other Topics Concern  . Not on file  Social History Narrative  . Not on file    Review of Systems  HENT: Positive for congestion.   Genitourinary: Positive for dysuria and frequency.       Loss of urine with cough and sneeze  All other systems reviewed and are negative.   PHYSICAL EXAMINATION:    BP (!) 142/80 (BP Location: Right Arm, Patient Position: Bed low/side rails up, Cuff Size: Normal)   Pulse 76   Ht 5' (1.524 m)   Wt 194 lb 12.8 oz (88.4 kg)   LMP 12/02/2017 (Exact Date)   BMI 38.04 kg/m     General appearance: alert, cooperative and appears stated age  Pelvic: External genitalia:  no lesions              Urethra:  normal appearing urethra with no masses, tenderness or lesions              Bartholins and Skenes: normal                 Vagina: normal appearing vagina with normal color and yellow thin discharge, no lesions              Cervix: no lesions                Bimanual Exam:  Uterus:   10 week size, globular, and mildly tender.               Adnexa: no mass, fullness, tenderness          Chaperone was present for exam.  ASSESSMENT  Vaginal discharge.  Current UTI on Z pack.  No evidence of UTI.  Fibroids.  Menses normalizing.   PLAN  We reviewed different forms of vaginitis.  Treatment options for bacterial vaginosis reviewed and patient has a  preference for Flagyl if needed.  Affirm testing.  Will continue observational management of her fibroids.  Continue Probiotics.  FU in February for annual exam.     An After Visit Summary was printed and given to the patient.  ___15___ minutes face to face time of which over 50% was spent in counseling.

## 2017-12-28 LAB — VAGINITIS/VAGINOSIS, DNA PROBE
Candida Species: NEGATIVE
GARDNERELLA VAGINALIS: POSITIVE — AB
TRICHOMONAS VAG: NEGATIVE

## 2017-12-29 ENCOUNTER — Telehealth: Payer: Self-pay | Admitting: *Deleted

## 2017-12-29 NOTE — Telephone Encounter (Signed)
-----   Message from Nunzio Cobbs, MD sent at 12/28/2017 11:53 AM EST ----- Please inform of Affirm result showing bacterial vaginosis. She may treat with Flagyl 500 mg po bid for 7 days or Metrogel pv at hs for 5 nights.  I believe she will have the preference for Flagyl. Please send Rx to pharmacy of choice. ETOH precautions.

## 2017-12-29 NOTE — Telephone Encounter (Signed)
Notes recorded by Burnice Logan, RN on 12/29/2017 at 4:35 PM EST Left message to call Sharee Pimple, RN at [facility].

## 2017-12-30 MED ORDER — METRONIDAZOLE 500 MG PO TABS: 500.0000 mg | ORAL_TABLET | Freq: Two times a day (BID) | ORAL | 0 refills | Status: DC

## 2017-12-30 NOTE — Telephone Encounter (Signed)
Spoke with patient, advised as seen below per Dr. Quincy Simmonds. Rx for Flagyl PO to verified pharmacy. ETOH precautions reviewed. Patient verbalizes understanding and is agreeable. Encounter closed.

## 2018-01-26 ENCOUNTER — Telehealth: Payer: Self-pay | Admitting: Obstetrics and Gynecology

## 2018-01-26 NOTE — Telephone Encounter (Signed)
Patient left voicemail over lunch requesting [facility].

## 2018-01-26 NOTE — Telephone Encounter (Signed)
Left message to call Ellsie Violette, RN at [facility] [phone].   

## 2018-01-26 NOTE — Progress Notes (Signed)
GYNECOLOGY  VISIT   HPI: 47 y.o.   Single  African American  female   [phone] with No LMP recorded. Patient is perimenopausal.   here for lump in right breast which has gotten larger.    Rash between her breasts.   Having respiratory infection and tx with Z pack.  Still with congestion.   GYNECOLOGIC HISTORY: No LMP recorded. Patient is perimenopausal. Contraception: Tubal Menopausal hormone therapy:  none Last mammogram: 03/05/2019BI-RADS CATEGORY 1: Negative. Last pap smear: 2017 Neg per patient        OB History    Gravida  3   Para  2   Term  2   Preterm      AB  1   Living  2     SAB      TAB      Ectopic      Multiple      Live Births                 Patient Active Problem List   Diagnosis Date Noted  . Pelvic pain 11/06/2017  . Anxiety 08/05/2017  . Stress incontinence 05/20/2017  . Fibroids 04/02/2017    Past Medical History:  Diagnosis Date  . Anxiety   . Dysmenorrhea   . Fibroid   . Gastritis 2019  . GERD (gastroesophageal reflux disease)   . Urinary incontinence     Past Surgical History:  Procedure Laterality Date  . COLONOSCOPY WITH ESOPHAGOGASTRODUODENOSCOPY (EGD)  09/24/2017  . TUBAL LIGATION      Current Outpatient Medications  Medication Sig Dispense Refill  . Albuterol Sulfate (PROAIR RESPICLICK) 626 (90 Base) MCG/ACT AEPB Inhale 2 puffs into the lungs every 4 (four) hours. 1 each 0  . dextromethorphan-guaiFENesin (ROBITUSSIN-DM) 10-100 MG/5ML liquid Take 5 mLs by mouth every 4 (four) hours as needed for cough.    . Escitalopram Oxalate (LEXAPRO PO) Take 10 mg by mouth every morning.     . famotidine (PEPCID AC) 10 MG chewable tablet Chew 10 mg by mouth 2 (two) times daily.    . fexofenadine (ALLEGRA) 180 MG tablet Take 180 mg by mouth daily.    Marland Kitchen FIBER PO Take by mouth 3 (three) times daily.    . fluticasone (FLONASE) 50 MCG/ACT nasal spray Place 1 spray into both nostrils daily. 16 g 0  . Multiple  Vitamins-Minerals (MULTIVITAMIN ADULT PO) Take by mouth.    . Probiotic Product (PROBIOTIC PO) Take by mouth daily.    . Simethicone (GAS-X PO) Take 1 tablet by mouth as needed.     No current facility-administered medications for this visit.      ALLERGIES: Protonix [pantoprazole sodium]; Latex; and Penicillins  Family History  Problem Relation Age of Onset  . Thyroid disease Mother   . Lupus Mother   . Breast cancer Maternal Aunt   . Hypertension Maternal Grandmother     Social History   Socioeconomic History  . Marital status: Single    Spouse name: Not on file  . Number of children: Not on file  . Years of education: Not on file  . Highest education level: Not on file  Occupational History  . Not on file  Social Needs  . Financial resource strain: Not on file  . Food insecurity:    Worry: Not on file    Inability: Not on file  . Transportation needs:    Medical: Not on file    Non-medical: Not on file  Tobacco Use  .  Smoking status: Current Every Day Smoker    Packs/day: 1.00    Types: Cigarettes  . Smokeless tobacco: Never Used  Substance and Sexual Activity  . Alcohol use: Yes    Alcohol/week: 1.0 standard drinks    Types: 1 Glasses of wine per week    Comment: social  . Drug use: No  . Sexual activity: Yes    Birth control/protection: Surgical    Comment: tubal  Lifestyle  . Physical activity:    Days per week: Not on file    Minutes per session: Not on file  . Stress: Not on file  Relationships  . Social connections:    Talks on phone: Not on file    Gets together: Not on file    Attends religious service: Not on file    Active member of club or organization: Not on file    Attends meetings of clubs or organizations: Not on file    Relationship status: Not on file  . Intimate partner violence:    Fear of current or ex partner: Not on file    Emotionally abused: Not on file    Physically abused: Not on file    Forced sexual activity: Not on  file  Other Topics Concern  . Not on file  Social History Narrative  . Not on file    Review of Systems  HENT:       Head cold/congestion  All other systems reviewed and are negative.   PHYSICAL EXAMINATION:    BP 122/76 (BP Location: Right Arm, Patient Position: Sitting, Cuff Size: Large)   Pulse 88   Ht 5' (1.524 m)   Wt 194 lb (88 kg)   BMI 37.89 kg/m     General appearance: alert, cooperative and appears stated age Lungs:  CTA bilaterally.  Cor:  S1S2 RRR.  Breasts: left -  normal appearance, no masses or tenderness, No nipple retraction or dimpling, No nipple discharge or bleeding, No axillary adenopathy. Right - 5 cm mass, tender,  No nipple retraction or dimpling, No nipple discharge or bleeding, No axillary adenopathy.  Skin:  Raised erythema patch between breasts.   Chaperone was present for exam.  ASSESSMENT  Right breast mass.  Skin rash.  Suspect tinea corporis.  URI.   PLAN  Plan for dx right mammogram and right breast US.  Possible drainage of right ovarian cyst.  Clotrimazole.   Add Mucinex to regimen.    An After Visit Summary was printed and given to the patient.  ___15___ minutes face to face time of which over 50% was spent in counseling.

## 2018-01-26 NOTE — Telephone Encounter (Signed)
Spoke with patient. Patient reports lump in right breast that has become larger over the past couple of days. Patient states cyst has been present, has been imaged in the past, was advised to f/u if any changes. Reports itching around right nipple, pain and breast feels heavier. Denies redness, warmth, nipple d/c or fever/chills.   Recommended OV for further evaluation. OV scheduled for 12/8 at 8am with Dr. Quincy Simmonds.   Routing to provider for final review. Patient is agreeable to disposition. Will close encounter.

## 2018-01-27 ENCOUNTER — Encounter: Payer: Self-pay | Admitting: Obstetrics and Gynecology

## 2018-01-27 ENCOUNTER — Telehealth: Payer: Self-pay | Admitting: Obstetrics and Gynecology

## 2018-01-27 ENCOUNTER — Ambulatory Visit (INDEPENDENT_AMBULATORY_CARE_PROVIDER_SITE_OTHER): Payer: BLUE CROSS/BLUE SHIELD | Admitting: Obstetrics and Gynecology

## 2018-01-27 ENCOUNTER — Other Ambulatory Visit: Payer: Self-pay

## 2018-01-27 ENCOUNTER — Telehealth: Payer: Self-pay | Admitting: *Deleted

## 2018-01-27 VITALS — BP 122/76 | HR 88 | Ht 60.0 in | Wt 194.0 lb

## 2018-01-27 DIAGNOSIS — J069 Acute upper respiratory infection, unspecified: Secondary | ICD-10-CM

## 2018-01-27 DIAGNOSIS — N6315 Unspecified lump in the right breast, overlapping quadrants: Secondary | ICD-10-CM

## 2018-01-27 DIAGNOSIS — B354 Tinea corporis: Secondary | ICD-10-CM | POA: Diagnosis not present

## 2018-01-27 DIAGNOSIS — N6310 Unspecified lump in the right breast, unspecified quadrant: Secondary | ICD-10-CM

## 2018-01-27 DIAGNOSIS — J060 Acute laryngopharyngitis: Secondary | ICD-10-CM | POA: Diagnosis not present

## 2018-01-27 NOTE — Progress Notes (Signed)
Patient scheduled for right breast DX MMG: right breast US and right breast aspiration, if needed. Scheduled at [facility] on 01/31/18 arriving at 11:10am for 11:30am appt. See telephone encounter dated 01/27/18.

## 2018-01-27 NOTE — Telephone Encounter (Signed)
Patient scheduled for right breast DX MMG: right breast US and right breast aspiration, if needed. Scheduled at [place] on 01/31/18 arriving at 11:10am for 11:30am appt. See telephone encounter dated 01/27/18.     Call to patient to notify of appt details, left detailed message, ok per dpr. Advised as seen above. Advised to return call to office if any additional questions/concerns.

## 2018-01-27 NOTE — Telephone Encounter (Signed)
Patient returned a call to Ruth Kennedy I notified the patient of the appointment detail at [facility].

## 2018-01-27 NOTE — Telephone Encounter (Signed)
Encounter closed

## 2018-01-31 ENCOUNTER — Ambulatory Visit
Admission: RE | Admit: 2018-01-31 | Discharge: 2018-01-31 | Disposition: A | Discharge status: Home / Self Care | Payer: BLUE CROSS/BLUE SHIELD | Source: Ambulatory Visit | Attending: Obstetrics and Gynecology | Admitting: Obstetrics and Gynecology

## 2018-01-31 DIAGNOSIS — N6310 Unspecified lump in the right breast, unspecified quadrant: Secondary | ICD-10-CM

## 2018-02-03 ENCOUNTER — Other Ambulatory Visit: Payer: Self-pay | Admitting: Emergency Medicine

## 2018-02-23 ENCOUNTER — Emergency Department (HOSPITAL_BASED_OUTPATIENT_CLINIC_OR_DEPARTMENT_OTHER)
Admission: EM | Admit: 2018-02-23 | Discharge: 2018-02-23 | Disposition: A | Discharge status: Home / Self Care | Payer: BLUE CROSS/BLUE SHIELD | Attending: Emergency Medicine | Admitting: Emergency Medicine

## 2018-02-23 ENCOUNTER — Other Ambulatory Visit: Payer: Self-pay

## 2018-02-23 ENCOUNTER — Encounter (HOSPITAL_BASED_OUTPATIENT_CLINIC_OR_DEPARTMENT_OTHER): Payer: Self-pay

## 2018-02-23 DIAGNOSIS — F17210 Nicotine dependence, cigarettes, uncomplicated: Secondary | ICD-10-CM | POA: Insufficient documentation

## 2018-02-23 DIAGNOSIS — R05 Cough: Secondary | ICD-10-CM | POA: Diagnosis present

## 2018-02-23 DIAGNOSIS — Z91040 Latex allergy status: Secondary | ICD-10-CM | POA: Diagnosis not present

## 2018-02-23 DIAGNOSIS — H6501 Acute serous otitis media, right ear: Secondary | ICD-10-CM | POA: Diagnosis not present

## 2018-02-23 DIAGNOSIS — Z79899 Other long term (current) drug therapy: Secondary | ICD-10-CM | POA: Diagnosis not present

## 2018-02-23 DIAGNOSIS — R058 Other specified cough: Secondary | ICD-10-CM

## 2018-02-23 MED ORDER — BENZONATATE 100 MG PO CAPS: 100.0000 mg | ORAL_CAPSULE | Freq: Three times a day (TID) | ORAL | 0 refills | Status: DC

## 2018-02-23 MED ORDER — DOXYCYCLINE HYCLATE 100 MG PO CAPS: 100.0000 mg | ORAL_CAPSULE | Freq: Two times a day (BID) | ORAL | 0 refills | Status: DC

## 2018-02-23 NOTE — ED Triage Notes (Signed)
C/o flu like sx x -seen here x 2 for same c/o-NAD-steady gait

## 2018-02-23 NOTE — ED Provider Notes (Signed)
[facility] Provider Note   CSN: [id number] Arrival date & time: 02/23/18  1921     History   Chief Complaint Chief Complaint  Patient presents with  . Cough    HPI Ruth Kennedy is a 48 y.o. female.  The history is provided by the patient and medical records. No language interpreter was used.  Cough      48 year old female with history of anxiety presenting with flulike symptoms.  Patient states for the past 2 months she has had recurrent productive cough, congestion, drainage coming from her nose, throat irritation, occasional sneezing, and now for the past few days she also endorsed pain to her right ear with a stuffy sensation.  Her symptoms moderate in severity, and she has been evaluated in the ED twice for her complaint.  States she was given a Z-Pak but it did not provide any relief.  She is a smoker and smoked approximately a pack of cigarettes a day.  She denies any fever, nausea vomiting diarrhea, increased shortness of breath abdominal pain or back pain.  Past Medical History:  Diagnosis Date  . Anxiety   . Dysmenorrhea   . Fibroid   . Gastritis 2019  . GERD (gastroesophageal reflux disease)   . Urinary incontinence     Patient Active Problem List   Diagnosis Date Noted  . Pelvic pain 11/06/2017  . Anxiety 08/05/2017  . Stress incontinence 05/20/2017  . Fibroids 04/02/2017    Past Surgical History:  Procedure Laterality Date  . COLONOSCOPY WITH ESOPHAGOGASTRODUODENOSCOPY (EGD)  09/24/2017  . TUBAL LIGATION       OB History    Gravida  3   Para  2   Term  2   Preterm      AB  1   Living  2     SAB      TAB      Ectopic      Multiple      Live Births               Home Medications    Prior to Admission medications   Medication Sig Start Date End Date Taking? Authorizing Provider  Albuterol Sulfate (PROAIR RESPICLICK) 932 (90 Base) MCG/ACT AEPB Inhale 2 puffs into the lungs every 4  (four) hours. 12/21/17   Kinnie Feil, PA-C  dextromethorphan-guaiFENesin (ROBITUSSIN-DM) 10-100 MG/5ML liquid Take 5 mLs by mouth every 4 (four) hours as needed for cough.    [provider]  Escitalopram Oxalate (LEXAPRO PO) Take 10 mg by mouth every morning.     [provider]  famotidine (PEPCID AC) 10 MG chewable tablet Chew 10 mg by mouth 2 (two) times daily.    [provider]  fexofenadine (ALLEGRA) 180 MG tablet Take 180 mg by mouth daily.    [provider]  FIBER PO Take by mouth 3 (three) times daily.    [provider]  fluticasone (FLONASE) 50 MCG/ACT nasal spray Place 1 spray into both nostrils daily. 12/21/17   Kinnie Feil, PA-C  Multiple Vitamins-Minerals (MULTIVITAMIN ADULT PO) Take by mouth.    [provider]  Probiotic Product (PROBIOTIC PO) Take by mouth daily.    [provider]  Simethicone (GAS-X PO) Take 1 tablet by mouth as needed.    [provider]    Family History Family History  Problem Relation Age of Onset  . Thyroid disease Mother   . Lupus Mother   .  Breast cancer Maternal Aunt   . Hypertension Maternal Grandmother     Social History Social History   Tobacco Use  . Smoking status: Current Every Day Smoker    Packs/day: 1.00    Types: Cigarettes  . Smokeless tobacco: Never Used  Substance Use Topics  . Alcohol use: Yes    Alcohol/week: 1.0 standard drinks    Types: 1 Glasses of wine per week    Comment: social  . Drug use: No     Allergies   Protonix [pantoprazole sodium]; Latex; and Penicillins   Review of Systems Review of Systems  Respiratory: Positive for cough.   All other systems reviewed and are negative.    Physical Exam Updated Vital Signs BP 113/74 (BP Location: Left Arm)   Pulse 86   Temp 98.5 F (36.9 C) (Oral)   Resp 18   Ht 5' (1.524 m)   Wt 87.1 kg   LMP 03/20/2017   SpO2 97%   BMI 37.50 kg/m   Physical Exam Vitals  signs and nursing note reviewed.  Constitutional:      General: She is not in acute distress.    Appearance: She is well-developed. She is obese.  HENT:     Head: Atraumatic.     Comments: Ears: Right TM is erythematous without earlobe tenderness.  No pain with manipulation of the tragus.  Left TM normal Nose: Normal nares with mild clear rhinorrhea Throat: Uvula midline no tonsillar exudates  Eyes:     Conjunctiva/sclera: Conjunctivae normal.  Neck:     Musculoskeletal: Neck supple.  Cardiovascular:     Rate and Rhythm: Normal rate and regular rhythm.     Pulses: Normal pulses.     Heart sounds: Normal heart sounds.  Pulmonary:     Effort: Pulmonary effort is normal.     Breath sounds: Normal breath sounds. No stridor. No wheezing, rhonchi or rales.  Abdominal:     Palpations: Abdomen is soft.     Tenderness: There is no abdominal tenderness.  Musculoskeletal:        General: No swelling.  Skin:    Findings: No rash.  Neurological:     Mental Status: She is alert.      ED Treatments / Results  Labs (all labs ordered are listed, but only abnormal results are displayed) Labs Reviewed - No data to display  EKG None  Radiology No results found.  Procedures Procedures (including critical care time)  Medications Ordered in ED Medications - No data to display   Initial Impression / Assessment and Plan / ED Course  I have reviewed the triage vital signs and the nursing notes.  Pertinent labs & imaging results that were available during my care of the patient were reviewed by me and considered in my medical decision making (see chart for details).     BP 113/74 (BP Location: Left Arm)   Pulse 86   Temp 98.5 F (36.9 C) (Oral)   Resp 18   Ht 5' (1.524 m)   Wt 87.1 kg   LMP 03/20/2017   SpO2 97%   BMI 37.50 kg/m    Final Clinical Impressions(s) / ED Diagnoses   Final diagnoses:  Recurrent cough  Right acute serous otitis media, recurrence not  specified    ED Discharge Orders         Ordered    doxycycline (VIBRAMYCIN) 100 MG capsule  2 times daily     02/23/18 2035    benzonatate (TESSALON)  100 MG capsule  Every 8 hours     02/23/18 2035         8:23 PM Patient here with viral syndrome however it has been ongoing for nearly 2 months.  She has had several chest x-ray previously that was unremarkable.  She is not hypoxic.  Her lungs are clear on exam.  She does have a pain to her right ear and an erythematous TM therefore I felt patient would benefit from antibiotic for otitis media.  Cough medication prescribed.  Encourage patient to quit smoking.  She does have an albuterol inhaler to use at home.  Return precautions discussed.   Domenic Moras, PA-C 02/23/18 2036    Blanchie Dessert, MD 02/24/18 2202

## 2018-03-28 ENCOUNTER — Other Ambulatory Visit: Payer: Self-pay | Admitting: Obstetrics and Gynecology

## 2018-03-28 DIAGNOSIS — Z1231 Encounter for screening mammogram for malignant neoplasm of breast: Secondary | ICD-10-CM

## 2018-04-20 ENCOUNTER — Ambulatory Visit: Payer: BLUE CROSS/BLUE SHIELD | Admitting: Obstetrics and Gynecology

## 2018-04-28 ENCOUNTER — Ambulatory Visit: Payer: BLUE CROSS/BLUE SHIELD | Admitting: Obstetrics and Gynecology

## 2018-04-28 ENCOUNTER — Other Ambulatory Visit (HOSPITAL_COMMUNITY)
Admission: RE | Admit: 2018-04-28 | Discharge: 2018-04-28 | Disposition: A | Discharge status: Home / Self Care | Payer: BLUE CROSS/BLUE SHIELD | Source: Ambulatory Visit | Attending: Obstetrics and Gynecology | Admitting: Obstetrics and Gynecology

## 2018-04-28 ENCOUNTER — Encounter: Payer: Self-pay | Admitting: Obstetrics and Gynecology

## 2018-04-28 ENCOUNTER — Other Ambulatory Visit: Payer: Self-pay

## 2018-04-28 VITALS — BP 122/74 | HR 76 | Resp 16 | Ht 61.0 in | Wt 192.0 lb

## 2018-04-28 DIAGNOSIS — Z01419 Encounter for gynecological examination (general) (routine) without abnormal findings: Secondary | ICD-10-CM | POA: Diagnosis present

## 2018-04-28 DIAGNOSIS — R102 Pelvic and perineal pain: Secondary | ICD-10-CM | POA: Diagnosis not present

## 2018-04-28 DIAGNOSIS — R21 Rash and other nonspecific skin eruption: Secondary | ICD-10-CM

## 2018-04-28 DIAGNOSIS — N926 Irregular menstruation, unspecified: Secondary | ICD-10-CM

## 2018-04-28 DIAGNOSIS — Z113 Encounter for screening for infections with a predominantly sexual mode of transmission: Secondary | ICD-10-CM

## 2018-04-28 LAB — POCT URINALYSIS DIPSTICK
Bilirubin, UA: NEGATIVE
Glucose, UA: NEGATIVE
Ketones, UA: NEGATIVE
Leukocytes, UA: NEGATIVE
Nitrite, UA: NEGATIVE
Protein, UA: NEGATIVE
Urobilinogen, UA: 0.2 E.U./dL
pH, UA: 5 (ref 5.0–8.0)

## 2018-04-28 LAB — POCT URINE PREGNANCY: Preg Test, Ur: NEGATIVE

## 2018-04-28 MED ORDER — TRIAMCINOLONE ACETONIDE 0.025 % EX OINT: 1.0000 "application " | TOPICAL_OINTMENT | Freq: Two times a day (BID) | CUTANEOUS | 0 refills | Status: DC

## 2018-04-28 NOTE — Progress Notes (Signed)
48 y.o. [id number] Single African American/Indian female here for annual exam.    Patient states she had no menses 01/2018, 02/2018 or 03/2018.  Just started her menstruation. States it is like crumbs.  Bleeding is much lighter.  Not having the pain she was experiencing previously.  Hot flashes.   She does complain of rash on different parts of body and she attributes this to a reaction to Doxycycline. Treated for viral syndrome in January, 2020. She took a Z-pack and Doxycycline.   Nipples peeling.  Rash under her eyes, arms, breasts. Itching.   Smoker.   Does not tolerate Micronor.  Did not feel well.  Does want STD testing.   Vegetarian.   Urine Dip: Trace RBCs- on menses.  UPT:  Neg  PCP:   Laneta Simmers, NP  Patient's last menstrual period was 04/21/2018 (approximate).     Period Pattern: (!) Irregular Menstrual Flow: Heavy Menstrual Control: Tampon, Maxi pad     Sexually active: Yes.   female The current method of family planning is tubal ligation.    Exercising: Yes.    cardio, weights Smoker:  Yes, smokes 1ppd  Health Maintenance: Pap: 2017 normal per patient  History of abnormal Pap:  no MMG:  04-27-17 Neg/density C/BiRads1--Appt. tomorrow Colonoscopy:  2019 polyps Dr Collene Mares.  Also dx with GERD and gastritis.  BMD:   n/a  Result  na TDaP:  03-20-15 Gardasil:   no HIV: 08-09-16 NR Hep C: 11-04-17 Neg Screening Labs:  --- Flu vaccine:  Recommended.  She can do it at work.    reports that she has been smoking cigarettes. She has been smoking about 1.00 pack per day. She has never used smokeless tobacco. She reports current alcohol use of about 1.0 standard drinks of alcohol per week. She reports that she does not use drugs.  Past Medical History:  Diagnosis Date  . Anxiety   . Dysmenorrhea   . Fibroid   . Gastritis 2019  . GERD (gastroesophageal reflux disease)   . Urinary incontinence     Past Surgical History:  Procedure Laterality Date  . COLONOSCOPY  WITH ESOPHAGOGASTRODUODENOSCOPY (EGD)  09/24/2017  . TUBAL LIGATION      Current Outpatient Medications  Medication Sig Dispense Refill  . Albuterol Sulfate (PROAIR RESPICLICK) 712 (90 Base) MCG/ACT AEPB Inhale 2 puffs into the lungs every 4 (four) hours. 1 each 0  . Escitalopram Oxalate (LEXAPRO PO) Take 10 mg by mouth every morning.     . famotidine (PEPCID AC) 10 MG chewable tablet Chew 10 mg by mouth 2 (two) times daily.    . fexofenadine (ALLEGRA) 180 MG tablet Take 180 mg by mouth daily.    Marland Kitchen FIBER PO Take by mouth 3 (three) times daily.    . fluticasone (FLONASE) 50 MCG/ACT nasal spray Place 1 spray into both nostrils daily. 16 g 0  . Multiple Vitamins-Minerals (MULTIVITAMIN ADULT PO) Take by mouth.    . Probiotic Product (PROBIOTIC PO) Take by mouth daily.    . Simethicone (GAS-X PO) Take 1 tablet by mouth as needed.     No current facility-administered medications for this visit.     Family History  Problem Relation Age of Onset  . Thyroid disease Mother   . Lupus Mother   . Breast cancer Maternal Aunt   . Hypertension Maternal Grandmother     Review of Systems  Genitourinary:       Dark urine and lower pressure  Skin: Positive for rash (on  different parts of body).  All other systems reviewed and are negative.   Exam:   BP 122/74 (BP Location: Right Arm, Patient Position: Sitting, Cuff Size: Large)   Pulse 76   Resp 16   Ht 5\' 1"  (1.549 m)   Wt 192 lb (87.1 kg)   LMP 04/21/2018 (Approximate)   BMI 36.28 kg/m     General appearance: alert, cooperative and appears stated age Head: Normocephalic, without obvious abnormality, atraumatic Neck: no adenopathy, supple, symmetrical, trachea midline and thyroid normal to inspection and palpation Lungs: clear to auscultation bilaterally Breasts: normal appearance, no masses or tenderness, No nipple retraction or dimpling, No nipple discharge or bleeding, Nipples with dry skin and no lesions. No axillary or  supraclavicular adenopathy Heart: regular rate and rhythm Abdomen: soft, non-tender; no masses, no organomegaly Extremities: extremities normal, atraumatic, no cyanosis or edema Skin: Skin color, texture, turgor normal.  Slightly raised red rash between breasts, under arms. Lymph nodes: Cervical, supraclavicular, and axillary nodes normal. No abnormal inguinal nodes palpated Neurologic: Grossly normal  Pelvic: External genitalia:  no lesions              Urethra:  normal appearing urethra with no masses, tenderness or lesions              Bartholins and Skenes: normal                 Vagina: normal appearing vagina with normal color and discharge, no lesions              Cervix: no lesions.  Dark blood form os.  Bimanual Exam:  Uterus:    8 week size, contour, position, consistency, mobility, non-tender              Adnexa: no mass, fullness, tenderness              Rectal exam: Yes.  .  Confirms.              Anus:  normal sphincter tone, no lesions  Chaperone was present for exam.  Assessment:   Well woman visit with normal exam. Hx fibroids. Status post BTL.  Perimenopausal female.  Skin rash.   Dry nipples. Right breast lump at 12:00.  Cyst formation on imaging.  Smoker.  STD screening.   Plan: Mammogram screening. Recommended self breast awareness. Pap and HR HPV as above. Guidelines for Calcium, Vitamin D, regular exercise program including cardiovascular and weight bearing exercise. [place] for [employer].  Triamcinolone ointment bid prn.  Also has Rx already for Clotrimazole. Referral to dermatology.  STD screening and routine labs.  Return if no menses in 6 months.  May consider HRT if labs support this at that time. Follow up annually and prn.   After visit summary provided.

## 2018-04-28 NOTE — Patient Instructions (Signed)

## 2018-04-29 ENCOUNTER — Ambulatory Visit
Admission: RE | Admit: 2018-04-29 | Discharge: 2018-04-29 | Disposition: A | Discharge status: Home / Self Care | Payer: BLUE CROSS/BLUE SHIELD | Source: Ambulatory Visit | Attending: Obstetrics and Gynecology | Admitting: Obstetrics and Gynecology

## 2018-04-29 DIAGNOSIS — Z1231 Encounter for screening mammogram for malignant neoplasm of breast: Secondary | ICD-10-CM

## 2018-04-30 LAB — COMPREHENSIVE METABOLIC PANEL
ALT: 15 IU/L (ref 0–32)
AST: 18 IU/L (ref 0–40)
Albumin/Globulin Ratio: 2.2 (ref 1.2–2.2)
Albumin: 4.4 g/dL (ref 3.8–4.8)
Alkaline Phosphatase: 97 IU/L (ref 39–117)
BUN/Creatinine Ratio: 18 (ref 9–23)
BUN: 10 mg/dL (ref 6–24)
Bilirubin Total: <0.2 mg/dL (ref 0.0–1.2)
CHLORIDE: 104 mmol/L (ref 96–106)
CO2: 20 mmol/L (ref 20–29)
Calcium: 9 mg/dL (ref 8.7–10.2)
Creatinine, Ser: 0.57 mg/dL (ref 0.57–1.00)
GFR calc Af Amer: 128 mL/min/{1.73_m2} (ref >59)
GFR calc non Af Amer: 111 mL/min/{1.73_m2} (ref >59)
Globulin, Total: 2 g/dL (ref 1.5–4.5)
Glucose: 82 mg/dL (ref 65–99)
Potassium: 4 mmol/L (ref 3.5–5.2)
Sodium: 141 mmol/L (ref 134–144)
Total Protein: 6.4 g/dL (ref 6.0–8.5)

## 2018-04-30 LAB — CHLAMYDIA/GONOCOCCUS/TRICHOMONAS, NAA
Chlamydia by NAA: NEGATIVE
Gonococcus by NAA: NEGATIVE
Trich vag by NAA: NEGATIVE

## 2018-04-30 LAB — CBC
HEMOGLOBIN: 13.4 g/dL (ref 11.1–15.9)
Hematocrit: 39.5 % (ref 34.0–46.6)
MCH: 28.8 pg (ref 26.6–33.0)
MCHC: 33.9 g/dL (ref 31.5–35.7)
MCV: 85 fL (ref 79–97)
Platelets: 261 10*3/uL (ref 150–450)
RBC: 4.65 x10E6/uL (ref 3.77–5.28)
RDW: 16.3 % — ABNORMAL HIGH (ref 11.7–15.4)
WBC: 9 10*3/uL (ref 3.4–10.8)

## 2018-04-30 LAB — HEPATITIS C ANTIBODY: Hep C Virus Ab: <0.1 s/co ratio (ref 0.0–0.9)

## 2018-04-30 LAB — HEP, RPR, HIV PANEL
HIV Screen 4th Generation wRfx: NONREACTIVE
Hepatitis B Surface Ag: NEGATIVE
RPR Ser Ql: NONREACTIVE

## 2018-04-30 LAB — LIPID PANEL
Chol/HDL Ratio: 5.2 ratio — ABNORMAL HIGH (ref 0.0–4.4)
Cholesterol, Total: 209 mg/dL — ABNORMAL HIGH (ref 100–199)
HDL: 40 mg/dL (ref >39)
LDL Calculated: 128 mg/dL — ABNORMAL HIGH (ref 0–99)
Triglycerides: 204 mg/dL — ABNORMAL HIGH (ref 0–149)
VLDL Cholesterol Cal: 41 mg/dL — ABNORMAL HIGH (ref 5–40)

## 2018-05-03 ENCOUNTER — Other Ambulatory Visit: Payer: Self-pay | Admitting: Obstetrics and Gynecology

## 2018-05-03 DIAGNOSIS — R928 Other abnormal and inconclusive findings on diagnostic imaging of breast: Secondary | ICD-10-CM

## 2018-05-03 LAB — CYTOLOGY - PAP
Diagnosis: UNDETERMINED — AB
HPV: NOT DETECTED

## 2018-05-05 ENCOUNTER — Ambulatory Visit
Admission: RE | Admit: 2018-05-05 | Discharge: 2018-05-05 | Disposition: A | Discharge status: Home / Self Care | Payer: BLUE CROSS/BLUE SHIELD | Source: Ambulatory Visit | Attending: Obstetrics and Gynecology | Admitting: Obstetrics and Gynecology

## 2018-05-05 ENCOUNTER — Other Ambulatory Visit: Payer: BLUE CROSS/BLUE SHIELD

## 2018-05-05 ENCOUNTER — Other Ambulatory Visit: Payer: Self-pay

## 2018-05-05 DIAGNOSIS — R928 Other abnormal and inconclusive findings on diagnostic imaging of breast: Secondary | ICD-10-CM

## 2018-05-06 ENCOUNTER — Encounter: Payer: Self-pay | Admitting: Obstetrics and Gynecology

## 2018-05-10 ENCOUNTER — Telehealth: Payer: Self-pay | Admitting: Emergency Medicine

## 2018-05-10 NOTE — Telephone Encounter (Signed)
-----   Message from Nunzio Cobbs, MD sent at 05/06/2018  6:44 AM EDT ----- Please contact patient with results. Her cholesterol and triglycerides are elevated. I recommend a diet low in saturated fats and cholesterol and increase vigorous exercise.  She will need to follow up with her PCP regarding this.   Her blood counts and blood chemistries are all normal.   Her testing for infectious disease is all negative for HIV, syphilis, hepatitis B and C, trichomonas, gonorrhea, and chlamydia.   Her pap is ASCUS and negative HR HPV.  Due to the negative HR HPV, nothing further needs to be done.  This is not considered a high risk pap.  Her next pap is due in 3 years, but a yearly GYN examination is recommended! Pap recall - 02.

## 2018-05-10 NOTE — Telephone Encounter (Signed)
Spoke with patient results discussed. Pt verbalized understanding of results.   Will follow up with PCP regarding lipids.   Requests office visit with Dr. Quincy Simmonds to evaluate " a boil" on her left inner thigh near pubic area.  Has not had this occur before.  No fevers or swelling of skin, no redness or streaking of redness.  Painful to patient when sitting.  No respiratory symptoms.  Office visit tomorrow at patient request for time. Call back with any concerns.  Encounter closed.

## 2018-05-11 ENCOUNTER — Ambulatory Visit: Payer: BLUE CROSS/BLUE SHIELD | Admitting: Obstetrics and Gynecology

## 2018-05-11 ENCOUNTER — Encounter: Payer: Self-pay | Admitting: Obstetrics and Gynecology

## 2018-05-11 ENCOUNTER — Other Ambulatory Visit: Payer: Self-pay

## 2018-05-11 VITALS — BP 110/76 | HR 84 | Resp 14 | Ht 61.0 in | Wt 192.0 lb

## 2018-05-11 DIAGNOSIS — N762 Acute vulvitis: Secondary | ICD-10-CM

## 2018-05-11 MED ORDER — SULFAMETHOXAZOLE-TRIMETHOPRIM 800-160 MG PO TABS: 1.0000 | ORAL_TABLET | Freq: Two times a day (BID) | ORAL | 0 refills | Status: DC

## 2018-05-11 NOTE — Progress Notes (Signed)
GYNECOLOGY  VISIT   HPI: 48 y.o.   Single  African American  female   [phone] with Patient's last menstrual period was 04/21/2018 (approximate).   here for irritation     Notes a soreness and irritation that occurred after her annual exam on 04/28/18.  Not draining.  No fevers.  Does shave.  Not using any topical treatment.   Did a warm soak.   GYNECOLOGIC HISTORY: Patient's last menstrual period was 04/21/2018 (approximate). Contraception:  Tubal ligation Menopausal hormone therapy:  none Last mammogram:  04/29/18 BIRADS 0 Incomplete; 05/05/18 Bilateral Diagnostic MM/Left Breast US - BIRADS 2 benign/density c  Last pap smear:   04/28/18 ASCUS:Neg HR HPV        OB History    Gravida  3   Para  2   Term  2   Preterm      AB  1   Living  2     SAB      TAB      Ectopic      Multiple      Live Births                 Patient Active Problem List   Diagnosis Date Noted  . Pelvic pain 11/06/2017  . Anxiety 08/05/2017  . Stress incontinence 05/20/2017  . Fibroids 04/02/2017    Past Medical History:  Diagnosis Date  . Anxiety   . Dysmenorrhea   . Fibroid   . Gastritis 2019  . GERD (gastroesophageal reflux disease)   . Hyperlipidemia   . Urinary incontinence     Past Surgical History:  Procedure Laterality Date  . COLONOSCOPY WITH ESOPHAGOGASTRODUODENOSCOPY (EGD)  09/24/2017  . TUBAL LIGATION      Current Outpatient Medications  Medication Sig Dispense Refill  . Albuterol Sulfate (PROAIR RESPICLICK) 341 (90 Base) MCG/ACT AEPB Inhale 2 puffs into the lungs every 4 (four) hours. 1 each 0  . Escitalopram Oxalate (LEXAPRO PO) Take 10 mg by mouth every morning.     . famotidine (PEPCID AC) 10 MG chewable tablet Chew 10 mg by mouth 2 (two) times daily.    . fexofenadine (ALLEGRA) 180 MG tablet Take 180 mg by mouth daily.    Marland Kitchen FIBER PO Take by mouth 3 (three) times daily.    . fluticasone (FLONASE) 50 MCG/ACT nasal spray Place 1 spray into both nostrils  daily. 16 g 0  . Multiple Vitamins-Minerals (MULTIVITAMIN ADULT PO) Take by mouth.    . Probiotic Product (PROBIOTIC PO) Take by mouth daily.    . Simethicone (GAS-X PO) Take 1 tablet by mouth as needed.    . triamcinolone (KENALOG) 0.025 % ointment Apply 1 application topically 2 (two) times daily. 30 g 0   No current facility-administered medications for this visit.      ALLERGIES: Protonix [pantoprazole sodium]; Doxycycline; Latex; and Penicillins  Family History  Problem Relation Age of Onset  . Thyroid disease Mother   . Lupus Mother   . Breast cancer Maternal Aunt   . Hypertension Maternal Grandmother     Social History   Socioeconomic History  . Marital status: Single    Spouse name: Not on file  . Number of children: Not on file  . Years of education: Not on file  . Highest education level: Not on file  Occupational History  . Not on file  Social Needs  . Financial resource strain: Not on file  . Food insecurity:    Worry: Not  on file    Inability: Not on file  . Transportation needs:    Medical: Not on file    Non-medical: Not on file  Tobacco Use  . Smoking status: Current Every Day Smoker    Packs/day: 1.00    Types: Cigarettes  . Smokeless tobacco: Never Used  Substance and Sexual Activity  . Alcohol use: Yes    Alcohol/week: 1.0 standard drinks    Types: 1 Glasses of wine per week    Comment: social  . Drug use: No  . Sexual activity: Yes    Birth control/protection: Surgical    Comment: tubal  Lifestyle  . Physical activity:    Days per week: Not on file    Minutes per session: Not on file  . Stress: Not on file  Relationships  . Social connections:    Talks on phone: Not on file    Gets together: Not on file    Attends religious service: Not on file    Active member of club or organization: Not on file    Attends meetings of clubs or organizations: Not on file    Relationship status: Not on file  . Intimate partner violence:    Fear of  current or ex partner: Not on file    Emotionally abused: Not on file    Physically abused: Not on file    Forced sexual activity: Not on file  Other Topics Concern  . Not on file  Social History Narrative  . Not on file    Review of Systems  Constitutional: Negative.   HENT: Negative.   Eyes: Negative.   Respiratory: Negative.   Cardiovascular: Negative.   Gastrointestinal: Negative.   Endocrine: Negative.   Genitourinary: Negative.   Musculoskeletal: Negative.   Skin: Positive for rash.  Allergic/Immunologic: Negative.   Neurological: Negative.   Hematological: Negative.   Psychiatric/Behavioral: Negative.     PHYSICAL EXAMINATION:    BP 110/76 (BP Location: Right Arm, Patient Position: Sitting, Cuff Size: Large)   Pulse 84   Resp 14   Ht 5\' 1"  (1.549 m)   Wt 192 lb (87.1 kg)   LMP 04/21/2018 (Approximate)   BMI 36.28 kg/m     General appearance: alert, cooperative and appears stated age   Pelvic: External genitalia:  Left labia majora with 1 cm indurated area. Tender to touch.  No abscess to drain.               Urethra:  normal appearing urethra with no masses, tenderness or lesions              Bartholins and Skenes: normal                 Chaperone was present for exam.  ASSESSMENT  Vulvar cellulitis.   PLAN  Discussion of cellulitis and review of MRSA.  Not possible to do a culture today.  Start Bactrim DS po bid x 7 days.  Tylenol or ibuprofen for discomfort.  Call for increasing size, pain, or fever.    An After Visit Summary was printed and given to the patient.  ___15___ minutes face to face time of which over 50% was spent in counseling.

## 2018-07-29 ENCOUNTER — Telehealth: Payer: Self-pay | Admitting: Obstetrics and Gynecology

## 2018-07-29 NOTE — Telephone Encounter (Signed)
Please schedule office visit with me.  I agree with the patient seeing her PCP for back pain and seeing her GI for the rectal bleeding.

## 2018-07-29 NOTE — Telephone Encounter (Signed)
Patient would like to schedule an appointment. States she is having a recurring problem of no cycle. Also has seen blood in her stool three times. She is also having back pain.

## 2018-07-29 NOTE — Telephone Encounter (Signed)
Spoke with patient. She states no cycle since 01/2018--advised patient, per Dr.Silva's note 04-28-18 if no cycle in 6 mo return to office.(LMP on chart was 03/2018).  #2 pt. Complaining of low back pain which radiates into her buttocks. Advised patient she should see PCP to have this evaluated.  #3 pt. Complaining of rectal bleeding with hard BM several times in past 4-5 days. She has had some constipation, some loose stools. Patient had 2 polyps removed on colonoscopy with Dr.Mann 09/2017--advised patient to call Dr.Mann about constipation/loose stools/and rectal bleeding.  Patient will call me back if PCP or Dr.Mann do not address her concerns. Routed to provider

## 2018-08-01 NOTE — Telephone Encounter (Signed)
Spoke with patient and made appointment with Dr.Silva on 08-04-2018 at 4:30 for amenorrhea.

## 2018-08-02 ENCOUNTER — Other Ambulatory Visit: Payer: Self-pay

## 2018-08-04 ENCOUNTER — Encounter: Payer: Self-pay | Admitting: Obstetrics and Gynecology

## 2018-08-04 ENCOUNTER — Other Ambulatory Visit: Payer: Self-pay

## 2018-08-04 ENCOUNTER — Ambulatory Visit: Payer: BC Managed Care – PPO | Admitting: Obstetrics and Gynecology

## 2018-08-04 VITALS — BP 102/70 | HR 96 | Temp 97.8°F | Resp 14 | Wt 189.0 lb

## 2018-08-04 DIAGNOSIS — M545 Low back pain, unspecified: Secondary | ICD-10-CM

## 2018-08-04 DIAGNOSIS — N912 Amenorrhea, unspecified: Secondary | ICD-10-CM

## 2018-08-04 DIAGNOSIS — N951 Menopausal and female climacteric states: Secondary | ICD-10-CM | POA: Diagnosis not present

## 2018-08-04 DIAGNOSIS — K649 Unspecified hemorrhoids: Secondary | ICD-10-CM

## 2018-08-04 DIAGNOSIS — K625 Hemorrhage of anus and rectum: Secondary | ICD-10-CM

## 2018-08-04 LAB — POCT URINALYSIS DIPSTICK
Bilirubin, UA: NEGATIVE
Blood, UA: NEGATIVE
Glucose, UA: NEGATIVE
Ketones, UA: NEGATIVE
Leukocytes, UA: NEGATIVE
Nitrite, UA: NEGATIVE
Protein, UA: NEGATIVE
Urobilinogen, UA: 0.2 E.U./dL
pH, UA: 5 (ref 5.0–8.0)

## 2018-08-04 LAB — POCT URINE PREGNANCY: Preg Test, Ur: NEGATIVE

## 2018-08-04 NOTE — Progress Notes (Signed)
GYNECOLOGY  VISIT   HPI: 48 y.o.   Single  African American  female   [phone] with Patient's last menstrual period was 04/21/2018 (within days).   here for no menses and lower back pain. Blood in stool.  The rectal blood occurred three times, the last time being last week one week ago.  She had constipation which preceded this after she ate pizza.  She took laxatives to help with the constipation, and then the bleeding occurred. The stool was diarrhea and was mixed with blood.  She did colonoscopy and endoscopy in the past, and she was dx with colonic polyps and gastritis.   She is having left lower back pain radiating in to her buttocks.  This occurring for one to two weeks.  The pain did radiate down her back of leg once.  Feels like it is stiff.  She has been taking ibuprofen for the back pain.  Her back pain decreased with treating the constipation but it is not gone completely.   She is worried about kidney infection.  No dysuria.  No blood in her urine.   No menses since 04/21/18.   Prior LMP was Sept or October.  She is having a lot of hot flashes.  Hair and head are hot and sweaty.  Night sweats.  Wants something for the symptoms.   Eats a lot of soy.   Working from home.  Son moved out.   Urine dip - negative.  UPT negative.   GYNECOLOGIC HISTORY: Patient's last menstrual period was 04/21/2018 (within days). Contraception:  Tubal ligation Menopausal hormone therapy:  none Last mammogram:  04/29/18 BIRADS 0 Incomplete; 05/05/18 Bilateral Diagnostic MM/Left Breast US - BIRADS 2 benign/density c Last pap smear:   04/28/18 ASCUS:Neg HR HPV        OB History    Gravida  3   Para  2   Term  2   Preterm      AB  1   Living  2     SAB      TAB      Ectopic      Multiple      Live Births                 Patient Active Problem List   Diagnosis Date Noted  . Pelvic pain 11/06/2017  . Anxiety 08/05/2017  . Stress incontinence 05/20/2017  .  Fibroids 04/02/2017    Past Medical History:  Diagnosis Date  . Anxiety   . Dysmenorrhea   . Fibroid   . Gastritis 2019  . GERD (gastroesophageal reflux disease)   . Hyperlipidemia   . Urinary incontinence     Past Surgical History:  Procedure Laterality Date  . COLONOSCOPY WITH ESOPHAGOGASTRODUODENOSCOPY (EGD)  09/24/2017  . TUBAL LIGATION      Current Outpatient Medications  Medication Sig Dispense Refill  . Albuterol Sulfate (PROAIR RESPICLICK) 876 (90 Base) MCG/ACT AEPB Inhale 2 puffs into the lungs every 4 (four) hours. 1 each 0  . Escitalopram Oxalate (LEXAPRO PO) Take 10 mg by mouth every morning.     . famotidine (PEPCID AC) 10 MG chewable tablet Chew 10 mg by mouth 2 (two) times daily.    . fexofenadine (ALLEGRA) 180 MG tablet Take 180 mg by mouth daily.    . fluticasone (FLONASE) 50 MCG/ACT nasal spray Place 1 spray into both nostrils daily. 16 g 0  . Multiple Vitamins-Minerals (MULTIVITAMIN ADULT PO) Take by mouth.    Marland Kitchen  Simethicone (GAS-X PO) Take 1 tablet by mouth as needed.    . triamcinolone (KENALOG) 0.025 % ointment Apply 1 application topically 2 (two) times daily. 30 g 0  . FIBER PO Take by mouth 3 (three) times daily.    . Probiotic Product (PROBIOTIC PO) Take by mouth daily.     No current facility-administered medications for this visit.      ALLERGIES: Protonix [pantoprazole sodium], Doxycycline, Latex, and Penicillins  Family History  Problem Relation Age of Onset  . Thyroid disease Mother   . Lupus Mother   . Breast cancer Maternal Aunt   . Hypertension Maternal Grandmother     Social History   Socioeconomic History  . Marital status: Single    Spouse name: Not on file  . Number of children: Not on file  . Years of education: Not on file  . Highest education level: Not on file  Occupational History  . Not on file  Social Needs  . Financial resource strain: Not on file  . Food insecurity    Worry: Not on file    Inability: Not on file   . Transportation needs    Medical: Not on file    Non-medical: Not on file  Tobacco Use  . Smoking status: Current Every Day Smoker    Packs/day: 1.00    Types: Cigarettes  . Smokeless tobacco: Never Used  Substance and Sexual Activity  . Alcohol use: Yes    Alcohol/week: 1.0 standard drinks    Types: 1 Glasses of wine per week    Comment: social  . Drug use: No  . Sexual activity: Yes    Birth control/protection: Surgical    Comment: tubal  Lifestyle  . Physical activity    Days per week: Not on file    Minutes per session: Not on file  . Stress: Not on file  Relationships  . Social Herbalist on phone: Not on file    Gets together: Not on file    Attends religious service: Not on file    Active member of club or organization: Not on file    Attends meetings of clubs or organizations: Not on file    Relationship status: Not on file  . Intimate partner violence    Fear of current or ex partner: Not on file    Emotionally abused: Not on file    Physically abused: Not on file    Forced sexual activity: Not on file  Other Topics Concern  . Not on file  Social History Narrative  . Not on file    Review of Systems  Constitutional: Negative.   HENT: Negative.   Eyes: Negative.   Respiratory: Negative.   Cardiovascular: Negative.   Gastrointestinal: Negative.   Endocrine: Negative.   Genitourinary:       Amenorrhea   Musculoskeletal: Positive for back pain.  Skin: Negative.   Allergic/Immunologic: Negative.   Neurological: Negative.   Hematological: Negative.   Psychiatric/Behavioral: Negative.     PHYSICAL EXAMINATION:    BP 102/70 (BP Location: Right Arm, Patient Position: Sitting, Cuff Size: Large)   Pulse 96   Temp 97.8 F (36.6 C) (Temporal)   Resp 14   Wt 189 lb (85.7 kg)   LMP 04/21/2018 (Within Days) Comment: spotting  BMI 35.71 kg/m     General appearance: alert, cooperative and appears stated age Lungs: clear to auscultation  bilaterally Heart: regular rate and rhythm Abdomen: soft, non-tender, no masses,  no organomegaly No abnormal inguinal nodes palpated Neurologic: Grossly normal  Pelvic: External genitalia:  no lesions              Urethra:  normal appearing urethra with no masses, tenderness or lesions              Bartholins and Skenes: normal                 Vagina: normal appearing vagina with normal color and discharge, no lesions              Cervix: no lesions                Bimanual Exam:  Uterus:  normal size, contour, position, consistency, mobility, non-tender              Adnexa: no mass, fullness, tenderness              Rectal exam: Yes.  .  Confirms.              Anus:  normal sphincter tone,external hemorrhoids noted.   Chaperone was present for exam.  ASSESSMENT  Menopausal symptoms.  Hemorrhoids.  Rectal bleeding.  Recent constipation.  Back pain.  No evidence of urinary infection.  Vegetarian.   PLAN  Return to check [facility] and estradiol.  Lab is already closed for today.  We reviewed menopausal changes.  I discussed HRT, SSRI/SNRI, Catapress, herbal options for menopausal symptoms.  She will try an over the counter herbal option as she prefers a natural approach.  Discussed Preparation H, Tucks, and Aloe wipes for her hemorrhoids.  She will follow up with her gastroenterologist.  Brochure on menopause.    An After Visit Summary was printed and given to the patient.  __25____ minutes face to face time of which over 50% was spent in counseling.

## 2018-08-04 NOTE — Patient Instructions (Signed)

## 2018-08-05 ENCOUNTER — Other Ambulatory Visit (INDEPENDENT_AMBULATORY_CARE_PROVIDER_SITE_OTHER): Payer: BC Managed Care – PPO

## 2018-08-05 ENCOUNTER — Other Ambulatory Visit: Payer: Self-pay

## 2018-08-05 DIAGNOSIS — N951 Menopausal and female climacteric states: Secondary | ICD-10-CM

## 2018-08-06 LAB — ESTRADIOL: Estradiol: 5.2 pg/mL

## 2018-08-06 LAB — FOLLICLE STIMULATING HORMONE: FSH: 36 m[IU]/mL

## 2018-12-21 ENCOUNTER — Telehealth: Payer: Self-pay | Admitting: Obstetrics and Gynecology

## 2018-12-21 NOTE — Telephone Encounter (Signed)
Spoke with pt. Pt states having heavy cycles with dark old blood with some clots the last 4 months using 2 pads and tampon at night, but now spotting since AEX in June 2020. Pt states having vaginal sx of discharge and odor. Reviewed COVID precautions and screening negative. Schedule for OV 12/22/18 at 2pm. Pt agreeable.   Routed to Dr Quincy Simmonds. Encounter closed.

## 2018-12-21 NOTE — Telephone Encounter (Signed)
Patient would like to discuss her cycles. States she discussed being pre-menopausal with Dr. Quincy Simmonds during last appointment, but has since had 4 heavy cycles. She also believes she has an infection.

## 2018-12-22 ENCOUNTER — Other Ambulatory Visit: Payer: Self-pay

## 2018-12-22 ENCOUNTER — Ambulatory Visit: Payer: BC Managed Care – PPO | Admitting: Obstetrics and Gynecology

## 2018-12-22 ENCOUNTER — Encounter: Payer: Self-pay | Admitting: Obstetrics and Gynecology

## 2018-12-22 VITALS — BP 132/82 | HR 90 | Temp 98.0°F | Ht 61.0 in | Wt 185.0 lb

## 2018-12-22 DIAGNOSIS — R8290 Unspecified abnormal findings in urine: Secondary | ICD-10-CM | POA: Diagnosis not present

## 2018-12-22 DIAGNOSIS — R102 Pelvic and perineal pain: Secondary | ICD-10-CM | POA: Diagnosis not present

## 2018-12-22 DIAGNOSIS — Z113 Encounter for screening for infections with a predominantly sexual mode of transmission: Secondary | ICD-10-CM

## 2018-12-22 DIAGNOSIS — N760 Acute vaginitis: Secondary | ICD-10-CM

## 2018-12-22 DIAGNOSIS — N926 Irregular menstruation, unspecified: Secondary | ICD-10-CM

## 2018-12-22 LAB — POCT URINALYSIS DIPSTICK
Bilirubin, UA: NEGATIVE
Glucose, UA: NEGATIVE
Ketones, UA: NEGATIVE
Leukocytes, UA: NEGATIVE
Nitrite, UA: NEGATIVE
Protein, UA: NEGATIVE
Urobilinogen, UA: 0.2 E.U./dL
pH, UA: 5 (ref 5.0–8.0)

## 2018-12-22 LAB — POCT URINE PREGNANCY: Preg Test, Ur: NEGATIVE

## 2018-12-22 MED ORDER — NORETHINDRONE 0.35 MG PO TABS: 1.0000 | ORAL_TABLET | Freq: Every day | ORAL | 2 refills | Status: DC

## 2018-12-22 NOTE — Progress Notes (Signed)
GYNECOLOGY  VISIT   HPI: 48 y.o.   Single  African American/Indian  female   [phone] with Patient's last menstrual period was 12/01/2018 (approximate).   here for heavy irregular bleeding.  LMP 12-01-18.  Lasted 3 weeks.  PMP 8-end-20.  Lasted 1 week.  PMP 7-end-20.  Lasted 1 week.  Having heavy bleeding and clots.  Pad change every hour.  Using long pads and tampon at night.  Cramping also.  Hx dysmenorrhea and fibroids.  She has an encroaching fibroid at the uterine fundus.   Does not tolerate Micronor. She had fatigue and felt like she could not express herself well.   Patient also complaining of bilateral flank pain and lower pelvic pain. She feels like she has some type of "infection". Having odor and grey-yellow discharge.  Taking Ibuprofen.  Hx gastritis.    No change in partners.  Wants STD testing.   Smoker.  Brother died in [place], 12/17/22.  Urine Dip:  Trace RBCs.  GYNECOLOGIC HISTORY: Patient's last menstrual period was 12/01/2018 (approximate). Contraception:  Tubal Menopausal hormone therapy: none Last mammogram: 05-05-18 See Epic Last pap smear: 04-28-18 ASCUS:Neg HR HPV        OB History    Gravida  3   Para  2   Term  2   Preterm      AB  1   Living  2     SAB      TAB      Ectopic      Multiple      Live Births                 Patient Active Problem List   Diagnosis Date Noted  . Pelvic pain 11/06/2017  . Anxiety 08/05/2017  . Stress incontinence 05/20/2017  . Fibroids 04/02/2017    Past Medical History:  Diagnosis Date  . Anxiety   . Dysmenorrhea   . Fibroid   . Gastritis 2019  . GERD (gastroesophageal reflux disease)   . Hyperlipidemia   . Urinary incontinence     Past Surgical History:  Procedure Laterality Date  . COLONOSCOPY WITH ESOPHAGOGASTRODUODENOSCOPY (EGD)  09/24/2017  . TUBAL LIGATION      Current Outpatient Medications  Medication Sig Dispense Refill  . Albuterol Sulfate (PROAIR RESPICLICK)  123XX123 (90 Base) MCG/ACT AEPB Inhale 2 puffs into the lungs every 4 (four) hours. 1 each 0  . Escitalopram Oxalate (LEXAPRO PO) Take 10 mg by mouth every morning.     . famotidine (PEPCID AC) 10 MG chewable tablet Chew 10 mg by mouth 2 (two) times daily.    . fexofenadine (ALLEGRA) 180 MG tablet Take 180 mg by mouth daily.    Marland Kitchen FIBER PO Take by mouth 3 (three) times daily.    . fluticasone (FLONASE) 50 MCG/ACT nasal spray Place 1 spray into both nostrils daily. 16 g 0  . Multiple Vitamins-Minerals (MULTIVITAMIN ADULT PO) Take by mouth.    . Probiotic Product (PROBIOTIC PO) Take by mouth daily.    . Simethicone (GAS-X PO) Take 1 tablet by mouth as needed.    . triamcinolone (KENALOG) 0.025 % ointment Apply 1 application topically 2 (two) times daily. 30 g 0   No current facility-administered medications for this visit.      ALLERGIES: Protonix [pantoprazole sodium], Doxycycline, Latex, and Penicillins  Family History  Problem Relation Age of Onset  . Thyroid disease Mother   . Lupus Mother   . Breast cancer Maternal Aunt   .  Hypertension Maternal Grandmother     Social History   Socioeconomic History  . Marital status: Single    Spouse name: Not on file  . Number of children: Not on file  . Years of education: Not on file  . Highest education level: Not on file  Occupational History  . Not on file  Social Needs  . Financial resource strain: Not on file  . Food insecurity    Worry: Not on file    Inability: Not on file  . Transportation needs    Medical: Not on file    Non-medical: Not on file  Tobacco Use  . Smoking status: Current Every Day Smoker    Packs/day: 1.00    Types: Cigarettes  . Smokeless tobacco: Never Used  Substance and Sexual Activity  . Alcohol use: Yes    Alcohol/week: 1.0 standard drinks    Types: 1 Glasses of wine per week    Comment: social  . Drug use: No  . Sexual activity: Yes    Birth control/protection: Surgical    Comment: tubal   Lifestyle  . Physical activity    Days per week: Not on file    Minutes per session: Not on file  . Stress: Not on file  Relationships  . Social Herbalist on phone: Not on file    Gets together: Not on file    Attends religious service: Not on file    Active member of club or organization: Not on file    Attends meetings of clubs or organizations: Not on file    Relationship status: Not on file  . Intimate partner violence    Fear of current or ex partner: Not on file    Emotionally abused: Not on file    Physically abused: Not on file    Forced sexual activity: Not on file  Other Topics Concern  . Not on file  Social History Narrative  . Not on file    Review of Systems  Genitourinary: Positive for flank pain and pelvic pain.  All other systems reviewed and are negative.   PHYSICAL EXAMINATION:    BP 132/82 (Cuff Size: Large)   Pulse 90   Temp 98 F (36.7 C) (Temporal)   Ht 5\' 1"  (1.549 m)   Wt 185 lb (83.9 kg)   LMP 12/01/2018 (Approximate)   BMI 34.96 kg/m     General appearance: alert, cooperative and appears stated age Head: Normocephalic, without obvious abnormality, atraumatic Lungs: clear to auscultation bilaterally Heart: regular rate and rhythm Back:  No CVA tenderness. Abdomen: soft, non-tender, no masses,  no organomegaly Extremities: extremities normal, atraumatic, no cyanosis or edema No abnormal inguinal nodes palpated Neurologic: Grossly normal  Pelvic: External genitalia:  no lesions              Urethra:  normal appearing urethra with no masses, tenderness or lesions              Bartholins and Skenes: normal                 Vagina: normal appearing vagina with normal color and discharge, no lesions              Cervix: no lesions                Bimanual Exam:  Uterus:  8 week size, irregular, slightly tender.               Adnexa:  no mass, fullness, tenderness            Chaperone was present for  exam.  ASSESSMENT  BTL. Fibroids. Menorrhagia.  Pelvic pain/back pain.  Hx gastritis.   Microscopic hematuria.  Vaginitis. STD screening.  Bereavement.  PLAN  Urine micro and culture.  STD testing.  Vaginitis testing.  Start Micronor.  Will do a trial again. Tylenol Extra Strength.  Support given for the loss of her brother.   I did discuss Hospice as a resource to her.  FU in 2 months.    An After Visit Summary was printed and given to the patient.  _25_____ minutes face to face time of which over 50% was spent in counseling.

## 2018-12-23 LAB — HEP, RPR, HIV PANEL
HIV Screen 4th Generation wRfx: NONREACTIVE
Hepatitis B Surface Ag: NEGATIVE
RPR Ser Ql: NONREACTIVE

## 2018-12-23 LAB — URINALYSIS, MICROSCOPIC ONLY
Bacteria, UA: NONE SEEN
Casts: NONE SEEN /lpf

## 2018-12-23 LAB — HEPATITIS C ANTIBODY: Hep C Virus Ab: <0.1 s/co ratio (ref 0.0–0.9)

## 2018-12-24 LAB — NUSWAB VAGINITIS PLUS (VG+)
Candida albicans, NAA: NEGATIVE
Candida glabrata, NAA: NEGATIVE
Chlamydia trachomatis, NAA: NEGATIVE
Megasphaera 1: HIGH {score} — AB
Neisseria gonorrhoeae, NAA: NEGATIVE
Trich vag by NAA: NEGATIVE

## 2018-12-24 LAB — URINE CULTURE

## 2018-12-26 ENCOUNTER — Other Ambulatory Visit: Payer: Self-pay | Admitting: *Deleted

## 2018-12-26 MED ORDER — METRONIDAZOLE 0.75 % VA GEL: 1.0000 | Freq: Every day | VAGINAL | 0 refills | Status: AC

## 2019-02-24 DIAGNOSIS — B009 Herpesviral infection, unspecified: Secondary | ICD-10-CM

## 2019-02-24 HISTORY — DX: Herpesviral infection, unspecified: B00.9

## 2019-02-27 ENCOUNTER — Ambulatory Visit: Payer: BC Managed Care – PPO | Admitting: Obstetrics and Gynecology

## 2019-02-27 ENCOUNTER — Other Ambulatory Visit: Payer: Self-pay

## 2019-02-27 ENCOUNTER — Encounter: Payer: Self-pay | Admitting: Obstetrics and Gynecology

## 2019-02-27 VITALS — BP 138/78 | HR 108 | Temp 97.2°F | Ht 61.0 in | Wt 192.4 lb

## 2019-02-27 DIAGNOSIS — Z5181 Encounter for therapeutic drug level monitoring: Secondary | ICD-10-CM

## 2019-02-27 DIAGNOSIS — Z719 Counseling, unspecified: Secondary | ICD-10-CM | POA: Diagnosis not present

## 2019-02-27 MED ORDER — TRIAMCINOLONE ACETONIDE 0.025 % EX OINT: 1.0000 "application " | TOPICAL_OINTMENT | Freq: Two times a day (BID) | CUTANEOUS | 0 refills | Status: DC

## 2019-02-27 MED ORDER — NORETHINDRONE 0.35 MG PO TABS: 1.0000 | ORAL_TABLET | Freq: Every day | ORAL | 0 refills | Status: DC

## 2019-02-27 NOTE — Progress Notes (Signed)
GYNECOLOGY  VISIT   HPI: 49 y.o.   Single  African American/Indian  female   [phone] with Patient's last menstrual period was 01/08/2019 (approximate).   here for medication follow up with Micronor.  She has heavy bleeding and dysmenorrhea.  She has fibroids and one is encroaching on the fundus.   She restarted Micronor around 12/22/18. Slight spotting the end of November.  No period in December. No pain.  No problems with taking the Micronor.  Taking on time.   Trying to drink more water and making smoothies with fresh fruit and vegetables.  Wanting to loose weight and having a hard time because she is working from home. She is taking an over the counter medication, Carcinia Cambogia for appetite suppression. (has gren coffee in it.) Has lost 3 pounds so far.   She needs refill of triamcinolone which she uses for eczema on her breast.   GYNECOLOGIC HISTORY: Patient's last menstrual period was 01/08/2019 (approximate). Contraception: tubal Menopausal hormone therapy:  none Last mammogram:  05-05-18 See Epic Last pap smear:  04-28-18 ASCUS:Neg HR HPV        OB History    Gravida  3   Para  2   Term  2   Preterm      AB  1   Living  2     SAB      TAB      Ectopic      Multiple      Live Births                 Patient Active Problem List   Diagnosis Date Noted  . Pelvic pain 11/06/2017  . Anxiety 08/05/2017  . Stress incontinence 05/20/2017  . Fibroids 04/02/2017    Past Medical History:  Diagnosis Date  . Anxiety   . Dysmenorrhea   . Fibroid   . Gastritis 2019  . GERD (gastroesophageal reflux disease)   . Hyperlipidemia   . Urinary incontinence     Past Surgical History:  Procedure Laterality Date  . COLONOSCOPY WITH ESOPHAGOGASTRODUODENOSCOPY (EGD)  09/24/2017  . TUBAL LIGATION      Current Outpatient Medications  Medication Sig Dispense Refill  . Albuterol Sulfate (PROAIR RESPICLICK) 123XX123 (90 Base) MCG/ACT AEPB Inhale 2 puffs into  the lungs every 4 (four) hours. 1 each 0  . COCONUT OIL PO Take 1 capsule by mouth daily.    . Escitalopram Oxalate (LEXAPRO PO) Take 10 mg by mouth every morning.     . famotidine (PEPCID AC) 10 MG chewable tablet Chew 10 mg by mouth 2 (two) times daily.    . fexofenadine (ALLEGRA) 180 MG tablet Take 180 mg by mouth daily.    Marland Kitchen FIBER PO Take by mouth 3 (three) times daily.    . fluticasone (FLONASE) 50 MCG/ACT nasal spray Place 1 spray into both nostrils daily. 16 g 0  . methocarbamol (ROBAXIN) 500 MG tablet Take 500-1,000 mg by mouth every 6 (six) hours as needed.    . Multiple Minerals (CALCIUM/MAGNESIUM/ZINC PO) Take 1 tablet by mouth daily.    . Multiple Vitamins-Minerals (MULTIVITAMIN ADULT PO) Take by mouth.    . norethindrone (MICRONOR) 0.35 MG tablet Take 1 tablet (0.35 mg total) by mouth daily. 1 Package 2  . Omega-3 Fatty Acids (FISH OIL) 1000 MG CAPS Take 1 capsule by mouth daily.    Marland Kitchen POTASSIUM PO Take 1 tablet by mouth 3 (three) times a week. This is OTC strength    .  Probiotic Product (PROBIOTIC PO) Take by mouth daily.    . Simethicone (GAS-X PO) Take 1 tablet by mouth as needed.    . triamcinolone (KENALOG) 0.025 % ointment Apply 1 application topically 2 (two) times daily. 30 g 0   No current facility-administered medications for this visit.     ALLERGIES: Protonix [pantoprazole sodium], Doxycycline, Latex, and Penicillins  Family History  Problem Relation Age of Onset  . Thyroid disease Mother   . Lupus Mother   . Breast cancer Maternal Aunt   . Hypertension Maternal Grandmother     Social History   Socioeconomic History  . Marital status: Single    Spouse name: Not on file  . Number of children: Not on file  . Years of education: Not on file  . Highest education level: Not on file  Occupational History  . Not on file  Tobacco Use  . Smoking status: Current Every Day Smoker    Packs/day: 1.00    Types: Cigarettes  . Smokeless tobacco: Never Used   Substance and Sexual Activity  . Alcohol use: Yes    Alcohol/week: 1.0 standard drinks    Types: 1 Glasses of wine per week    Comment: social  . Drug use: No  . Sexual activity: Yes    Birth control/protection: Surgical    Comment: tubal  Other Topics Concern  . Not on file  Social History Narrative  . Not on file   Social Determinants of Health   Financial Resource Strain:   . Difficulty of Paying Living Expenses: Not on file  Food Insecurity:   . Worried About Charity fundraiser in the Last Year: Not on file  . Ran Out of Food in the Last Year: Not on file  Transportation Needs:   . Lack of Transportation (Medical): Not on file  . Lack of Transportation (Non-Medical): Not on file  Physical Activity:   . Days of Exercise per Week: Not on file  . Minutes of Exercise per Session: Not on file  Stress:   . Feeling of Stress : Not on file  Social Connections:   . Frequency of Communication with Friends and Family: Not on file  . Frequency of Social Gatherings with Friends and Family: Not on file  . Attends Religious Services: Not on file  . Active Member of Clubs or Organizations: Not on file  . Attends Archivist Meetings: Not on file  . Marital Status: Not on file  Intimate Partner Violence:   . Fear of Current or Ex-Partner: Not on file  . Emotionally Abused: Not on file  . Physically Abused: Not on file  . Sexually Abused: Not on file    Review of Systems  All other systems reviewed and are negative.   PHYSICAL EXAMINATION:    BP 138/78   Pulse (!) 108   Temp (!) 97.2 F (36.2 C) (Temporal)   Ht 5\' 1"  (1.549 m)   Wt 192 lb 6.4 oz (87.3 kg)   LMP 01/08/2019 (Approximate)   BMI 36.35 kg/m     General appearance: alert, cooperative and appears stated age  ASSESSMENT  Medication monitoring visit.  Micronor working well to control her menorrhagia and dysmenorrhea.  Hyperlipidemia.  Health education.  Status post BTL.  PLAN  Continue  Micronor.  Rx for 3 months.  Refill of Triamcinolone ointment.  We discussed increasing her exercise and doing strength training in addition to her new eating routine.  She will follow up  in 2 months for her annual exam and mammogram.   An After Visit Summary was printed and given to the patient.  __15___ minutes face to face time of which over 50% was spent in counseling.

## 2019-04-26 ENCOUNTER — Other Ambulatory Visit: Payer: Self-pay | Admitting: Family

## 2019-04-26 ENCOUNTER — Other Ambulatory Visit: Payer: Self-pay | Admitting: Obstetrics and Gynecology

## 2019-04-26 DIAGNOSIS — Z1231 Encounter for screening mammogram for malignant neoplasm of breast: Secondary | ICD-10-CM

## 2019-05-01 ENCOUNTER — Other Ambulatory Visit: Payer: Self-pay

## 2019-05-01 ENCOUNTER — Ambulatory Visit
Admission: RE | Admit: 2019-05-01 | Discharge: 2019-05-01 | Disposition: A | Discharge status: Home / Self Care | Payer: BC Managed Care – PPO | Source: Ambulatory Visit | Attending: Obstetrics and Gynecology | Admitting: Obstetrics and Gynecology

## 2019-05-01 ENCOUNTER — Ambulatory Visit: Payer: BC Managed Care – PPO | Admitting: Obstetrics and Gynecology

## 2019-05-01 ENCOUNTER — Encounter: Payer: Self-pay | Admitting: Obstetrics and Gynecology

## 2019-05-01 ENCOUNTER — Telehealth: Payer: Self-pay | Admitting: Obstetrics and Gynecology

## 2019-05-01 VITALS — BP 130/82 | HR 90 | Temp 97.2°F | Resp 18 | Ht 61.0 in | Wt 190.4 lb

## 2019-05-01 DIAGNOSIS — N921 Excessive and frequent menstruation with irregular cycle: Secondary | ICD-10-CM

## 2019-05-01 DIAGNOSIS — N898 Other specified noninflammatory disorders of vagina: Secondary | ICD-10-CM | POA: Diagnosis not present

## 2019-05-01 DIAGNOSIS — N766 Ulceration of vulva: Secondary | ICD-10-CM

## 2019-05-01 DIAGNOSIS — Z1231 Encounter for screening mammogram for malignant neoplasm of breast: Secondary | ICD-10-CM

## 2019-05-01 DIAGNOSIS — Z01419 Encounter for gynecological examination (general) (routine) without abnormal findings: Secondary | ICD-10-CM | POA: Diagnosis not present

## 2019-05-01 MED ORDER — NORETHINDRONE ACETATE 5 MG PO TABS: 5.0000 mg | ORAL_TABLET | Freq: Every day | ORAL | 2 refills | Status: DC

## 2019-05-01 MED ORDER — LIDOCAINE 5 % EX OINT: 1.0000 "application " | TOPICAL_OINTMENT | Freq: Three times a day (TID) | CUTANEOUS | 0 refills | Status: DC

## 2019-05-01 NOTE — Patient Instructions (Signed)

## 2019-05-01 NOTE — Telephone Encounter (Signed)
36 month recall entered for 04-30-2021.

## 2019-05-01 NOTE — Telephone Encounter (Signed)
Please place pap recall for 2023.  Patient had an ASCUS pap and negative HR HPV in 2020.

## 2019-05-01 NOTE — Progress Notes (Signed)
49 y.o. [id number] Single African American/Indian female here for annual exam.   Patient states has a "sore spot" above clitoral area.   She is taking Micronor for heavy bleeding and dysmenorrhea.  Monthly menses, some are better and some or worse.  Menses last about 10 - 12 days.  She is using a pad and tampon at night.  She is changing hourly during the day.  She does not like the pills.   She is having lower pelvic pain today, and she thinks her cycles is starting.   Feeling bloated.  Eats a lot of carbs.   She is having vaginal discharge coming out.  She had STD testing done in October, 2020.  PCP:   Laneta Simmers, NP  Patient's last menstrual period was 05/01/2019.           Sexually active: No.  The current method of family planning is tubal ligation.    Exercising: No.  The patient does not participate in regular exercise at present. walks occ. Smoker:  Yes, smokes 1ppd  Health Maintenance: Pap:  04-28-18 ASCUS:Neg HR HPV, 2017 normal per patient  History of abnormal Pap:  no MMG: 04-29-18 See Epic---Pt.has appt. 05-01-19 Colonoscopy:2019 polyps;next due --patient unsure, 5 - 10 years.  Dr. Collene Mares.  BMD:   n/a  Result  n/a TDaP:  03-20-15 Gardasil:   no HIV: 12-22-18 NR Hep C: 12-22-18 Neg Screening Labs:   See below.    reports that she has been smoking cigarettes. She has been smoking about 1.00 pack per day. She has never used smokeless tobacco. She reports current alcohol use of about 1.0 standard drinks of alcohol per week. She reports that she does not use drugs.  Past Medical History:  Diagnosis Date  . Anxiety   . Dysmenorrhea   . Fibroid   . Gastritis 2019  . GERD (gastroesophageal reflux disease)   . Hyperlipidemia   . Urinary incontinence     Past Surgical History:  Procedure Laterality Date  . COLONOSCOPY WITH ESOPHAGOGASTRODUODENOSCOPY (EGD)  09/24/2017  . TUBAL LIGATION      Current Outpatient Medications  Medication Sig Dispense Refill  .  Albuterol Sulfate (PROAIR RESPICLICK) 123XX123 (90 Base) MCG/ACT AEPB Inhale 2 puffs into the lungs every 4 (four) hours. 1 each 0  . COCONUT OIL PO Take 1 capsule by mouth daily.    . Escitalopram Oxalate (LEXAPRO PO) Take 10 mg by mouth every morning.     . famotidine (PEPCID AC) 10 MG chewable tablet Chew 10 mg by mouth 2 (two) times daily.    Marland Kitchen FIBER PO Take by mouth 3 (three) times daily.    . fluticasone (FLONASE) 50 MCG/ACT nasal spray Place 1 spray into both nostrils daily. 16 g 0  . loratadine (CLARITIN) 10 MG tablet Take 10 mg by mouth daily.    . methocarbamol (ROBAXIN) 500 MG tablet Take 500-1,000 mg by mouth every 6 (six) hours as needed.    . Multiple Minerals (CALCIUM/MAGNESIUM/ZINC PO) Take 1 tablet by mouth daily.    . Multiple Vitamins-Minerals (MULTIVITAMIN ADULT PO) Take by mouth.    . norethindrone (MICRONOR) 0.35 MG tablet Take 1 tablet (0.35 mg total) by mouth daily. 3 Package 0  . Omega-3 Fatty Acids (FISH OIL) 1000 MG CAPS Take 1 capsule by mouth daily.    Marland Kitchen POTASSIUM PO Take 1 tablet by mouth 3 (three) times a week. This is OTC strength    . Probiotic Product (PROBIOTIC PO) Take by mouth  daily.    . Simethicone (GAS-X PO) Take 1 tablet by mouth as needed.    . triamcinolone (KENALOG) 0.025 % ointment Apply 1 application topically 2 (two) times daily. 30 g 0   No current facility-administered medications for this visit.    Family History  Problem Relation Age of Onset  . Thyroid disease Mother   . Lupus Mother   . Stroke Father        TIA  . Breast cancer Maternal Aunt   . Hypertension Maternal Grandmother     Review of Systems  All other systems reviewed and are negative.   Exam:   BP 130/82 (Cuff Size: Large)   Pulse 90   Temp (!) 97.2 F (36.2 C) (Temporal)   Resp 18   Ht 5\' 1"  (1.549 m)   Wt 190 lb 6.4 oz (86.4 kg)   LMP 05/01/2019   BMI 35.98 kg/m     General appearance: alert, cooperative and appears stated age Head: normocephalic, without  obvious abnormality, atraumatic Neck: no adenopathy, supple, symmetrical, trachea midline and thyroid normal to inspection and palpation Lungs: clear to auscultation bilaterally Breasts: normal appearance, no masses or tenderness, No nipple retraction or dimpling, No nipple discharge or bleeding, No axillary adenopathy Heart: regular rate and rhythm Abdomen: soft, non-tender; no masses, no organomegaly Extremities: extremities normal, atraumatic, no cyanosis or edema Skin: skin color, texture, turgor normal. No rashes or lesions Lymph nodes: cervical, supraclavicular, and axillary nodes normal. Neurologic: grossly normal  Pelvic: External genitalia:  Vulvar ulcer to the left of clitoris.               No abnormal inguinal nodes palpated.              Urethra:  normal appearing urethra with no masses, tenderness or lesions              Bartholins and Skenes: normal                 Vagina: normal appearing vagina with normal color and discharge, no lesions              Cervix: no lesions.  Small amount of clot.               Pap taken: No. Bimanual Exam:  Uterus:  Enlarged with globular shape and tenderness.                Adnexa: no mass, fullness, tenderness              Rectal exam: Yes.  .  Confirms.              Anus:  normal sphincter tone, no lesions  Chaperone was present for exam.  Assessment:   Well woman visit with normal exam. Hx fibroids. Prolonged menses.  Status post BTL.  Perimenopausal female.  Smoker.  Vulvar ulcer.   Plan: Mammogram screening discussed. Self breast awareness reviewed. Pap and HR HPV as above. Guidelines for Calcium, Vitamin D, regular exercise program including cardiovascular and weight bearing exercise. Stop Micronor and start Aygestin 5 mg po bid.  Lidocaine ointment for vulvar ulceration pain.  Affirm.  HSV testing.  Check CBC, iron, ferritin, and TSH. We reviewed a healthy diet.   Follow up in 3 months, annually and prn.    After  visit summary provided.

## 2019-05-02 LAB — VAGINITIS/VAGINOSIS, DNA PROBE
Candida Species: NEGATIVE
Gardnerella vaginalis: POSITIVE — AB
Trichomonas vaginosis: NEGATIVE

## 2019-05-02 LAB — CBC
Hematocrit: 37.6 % (ref 34.0–46.6)
Hemoglobin: 12.3 g/dL (ref 11.1–15.9)
MCH: 28.3 pg (ref 26.6–33.0)
MCHC: 32.7 g/dL (ref 31.5–35.7)
MCV: 86 fL (ref 79–97)
Platelets: 269 10*3/uL (ref 150–450)
RBC: 4.35 x10E6/uL (ref 3.77–5.28)
RDW: 14.6 % (ref 11.7–15.4)
WBC: 10.5 10*3/uL (ref 3.4–10.8)

## 2019-05-02 LAB — FERRITIN: Ferritin: 15 ng/mL (ref 15–150)

## 2019-05-02 LAB — IRON: Iron: 37 ug/dL (ref 27–159)

## 2019-05-02 LAB — TSH: TSH: 1.64 u[IU]/mL (ref 0.450–4.500)

## 2019-05-03 ENCOUNTER — Other Ambulatory Visit: Payer: Self-pay | Admitting: *Deleted

## 2019-05-03 MED ORDER — METRONIDAZOLE 500 MG PO TABS: 500.0000 mg | ORAL_TABLET | Freq: Two times a day (BID) | ORAL | 0 refills | Status: DC

## 2019-05-04 ENCOUNTER — Encounter: Payer: Self-pay | Admitting: Obstetrics and Gynecology

## 2019-05-04 LAB — HSV NAA
HSV 1 NAA: NEGATIVE
HSV 2 NAA: POSITIVE — AB

## 2019-05-08 NOTE — Progress Notes (Signed)
GYNECOLOGY  VISIT   HPI: 49 y.o.   Single  African American/Indian  female   [phone] with Patient's last menstrual period was 05/01/2019.   here to discuss test results and possible further STD testing.    Patient had a vulvar ulcer to the left of her clitoris at her annual exam on 05/01/19.  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  She also tested positive for BV, for which she was treated with Flagyl.   Patient does not understand where she got this.  She is upset because she has asked for full STD testing in the past, and this was not done in the past.   GYNECOLOGIC HISTORY: Patient's last menstrual period was 05/01/2019. Contraception: Tubal Menopausal hormone therapy: none Last mammogram: 05-01-19 3D/Neg/Waxing and waning circumscribed equal density masses bilaterally are consistent with a changing cystic pattern/density C/BiRads2 Last pap smear: 04-28-18 ASCUS:Neg HR HPV, 2017 normal per patient        OB History    Gravida  3   Para  2   Term  2   Preterm      AB  1   Living  2     SAB      TAB      Ectopic      Multiple      Live Births                 Patient Active Problem List   Diagnosis Date Noted  . Pelvic pain 11/06/2017  . Anxiety 08/05/2017  . Stress incontinence 05/20/2017  . Fibroids 04/02/2017    Past Medical History:  Diagnosis Date  . Anxiety   . Dysmenorrhea   . Fibroid   . Gastritis 2019  . GERD (gastroesophageal reflux disease)   . HSV-2 infection 2021  . Hyperlipidemia   . Urinary incontinence     Past Surgical History:  Procedure Laterality Date  . COLONOSCOPY WITH ESOPHAGOGASTRODUODENOSCOPY (EGD)  09/24/2017  . TUBAL LIGATION      Current Outpatient Medications  Medication Sig Dispense Refill  . Albuterol Sulfate (PROAIR RESPICLICK) 123XX123 (90 Base) MCG/ACT AEPB Inhale 2 puffs into the lungs every 4 (four) hours. 1 each 0  . COCONUT OIL PO Take 1 capsule by mouth daily.    . Escitalopram Oxalate  (LEXAPRO PO) Take 10 mg by mouth every morning.     . famotidine (PEPCID AC) 10 MG chewable tablet Chew 10 mg by mouth 2 (two) times daily.    Marland Kitchen FIBER PO Take by mouth 3 (three) times daily.    . fluticasone (FLONASE) 50 MCG/ACT nasal spray Place 1 spray into both nostrils daily. 16 g 0  . lidocaine (XYLOCAINE) 5 % ointment Apply 1 application topically 3 (three) times daily. 1.25 g 0  . loratadine (CLARITIN) 10 MG tablet Take 10 mg by mouth daily.    . methocarbamol (ROBAXIN) 500 MG tablet Take 500-1,000 mg by mouth every 6 (six) hours as needed.    . metroNIDAZOLE (FLAGYL) 500 MG tablet Take 1 tablet (500 mg total) by mouth 2 (two) times daily. 14 tablet 0  . Multiple Minerals (CALCIUM/MAGNESIUM/ZINC PO) Take 1 tablet by mouth daily.    . Multiple Vitamins-Minerals (MULTIVITAMIN ADULT PO) Take by mouth.    . norethindrone (AYGESTIN) 5 MG tablet Take 1 tablet (5 mg total) by mouth daily. Take one tablet by mouth twice daily. 60 tablet 2  . Omega-3 Fatty Acids (FISH OIL) 1000 MG CAPS  Take 1 capsule by mouth daily.    Marland Kitchen POTASSIUM PO Take 1 tablet by mouth 3 (three) times a week. This is OTC strength    . Probiotic Product (PROBIOTIC PO) Take by mouth daily.    . Simethicone (GAS-X PO) Take 1 tablet by mouth as needed.    . triamcinolone (KENALOG) 0.025 % ointment Apply 1 application topically 2 (two) times daily. 30 g 0   No current facility-administered medications for this visit.     ALLERGIES: Peanut oil, Protonix [pantoprazole sodium], Doxycycline, Latex, and Penicillins  Family History  Problem Relation Age of Onset  . Thyroid disease Mother   . Lupus Mother   . Stroke Father        TIA  . Breast cancer Maternal Aunt   . Hypertension Maternal Grandmother     Social History   Socioeconomic History  . Marital status: Single    Spouse name: Not on file  . Number of children: Not on file  . Years of education: Not on file  . Highest education level: Not on file  Occupational  History  . Not on file  Tobacco Use  . Smoking status: Current Every Day Smoker    Packs/day: 1.00    Types: Cigarettes  . Smokeless tobacco: Never Used  Substance and Sexual Activity  . Alcohol use: Yes    Alcohol/week: 1.0 standard drinks    Types: 1 Glasses of wine per week    Comment: social  . Drug use: No  . Sexual activity: Not Currently    Birth control/protection: Surgical    Comment: tubal  Other Topics Concern  . Not on file  Social History Narrative  . Not on file   Social Determinants of Health   Financial Resource Strain:   . Difficulty of Paying Living Expenses:   Food Insecurity:   . Worried About Charity fundraiser in the Last Year:   . Arboriculturist in the Last Year:   Transportation Needs:   . Film/video editor (Medical):   Marland Kitchen Lack of Transportation (Non-Medical):   Physical Activity:   . Days of Exercise per Week:   . Minutes of Exercise per Session:   Stress:   . Feeling of Stress :   Social Connections:   . Frequency of Communication with Friends and Family:   . Frequency of Social Gatherings with Friends and Family:   . Attends Religious Services:   . Active Member of Clubs or Organizations:   . Attends Archivist Meetings:   Marland Kitchen Marital Status:   Intimate Partner Violence:   . Fear of Current or Ex-Partner:   . Emotionally Abused:   Marland Kitchen Physically Abused:   . Sexually Abused:     Review of Systems  All other systems reviewed and are negative.   PHYSICAL EXAMINATION:    BP 136/76 (Cuff Size: Large)   Pulse 80 Comment: irregular/skips  Temp (!) 97 F (36.1 C) (Temporal)   Ht 5\' 1"  (1.549 m)   Wt 190 lb (86.2 kg)   LMP 05/01/2019   BMI 35.90 kg/m     General appearance: alert, cooperative and appears stated age  Pelvic: External genitalia:  no lesions              Urethra:  normal appearing urethra with no masses, tenderness or lesions              Bartholins and Skenes: normal  Vagina: normal  appearing vagina with normal color and discharge, no lesions              Cervix: no lesions                Bimanual Exam:  Uterus:  normal size, contour, position, consistency, mobility, non-tender              Adnexa: no mass, fullness, tenderness      Chaperone was present for exam.  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 Ulceration of vulva resolved.    PLAN  Comprehensive discussion of HSV, routine of transmission, asymptomatic shedding, different forms of testing for HSV, signs and symptoms of infection, antiviral therapy.  STD testing today including serum HSV 1 and 2 IgM and IgG, HIV, syphilis, hep B and C, gonorrhea, chlamydia, and trichomonas.  States she will pursue a second opinion if her serum testing is positive.    An After Visit Summary was printed and given to the patient.  _45_____ minutes face to face time of which over 50% was spent in counseling.

## 2019-05-09 ENCOUNTER — Encounter: Payer: Self-pay | Admitting: Obstetrics and Gynecology

## 2019-05-09 ENCOUNTER — Other Ambulatory Visit: Payer: Self-pay

## 2019-05-09 ENCOUNTER — Ambulatory Visit: Payer: BC Managed Care – PPO | Admitting: Obstetrics and Gynecology

## 2019-05-09 ENCOUNTER — Other Ambulatory Visit (HOSPITAL_COMMUNITY)
Admission: RE | Admit: 2019-05-09 | Discharge: 2019-05-09 | Disposition: A | Discharge status: Home / Self Care | Payer: BC Managed Care – PPO | Source: Ambulatory Visit | Attending: Obstetrics and Gynecology | Admitting: Obstetrics and Gynecology

## 2019-05-09 VITALS — BP 136/76 | HR 80 | Temp 97.0°F | Ht 61.0 in | Wt 190.0 lb

## 2019-05-09 DIAGNOSIS — Z113 Encounter for screening for infections with a predominantly sexual mode of transmission: Secondary | ICD-10-CM | POA: Insufficient documentation

## 2019-05-09 DIAGNOSIS — B009 Herpesviral infection, unspecified: Secondary | ICD-10-CM

## 2019-05-10 LAB — HSV 1 AND 2 IGM ABS, INDIRECT
HSV 1 IgM: <1:10 {titer}
HSV 2 IgM: <1:10 {titer}

## 2019-05-10 LAB — CERVICOVAGINAL ANCILLARY ONLY
Chlamydia: NEGATIVE
Comment: NEGATIVE
Comment: NEGATIVE
Comment: NORMAL
Neisseria Gonorrhea: NEGATIVE
Trichomonas: NEGATIVE

## 2019-05-10 LAB — HEP, RPR, HIV PANEL
HIV Screen 4th Generation wRfx: NONREACTIVE
Hepatitis B Surface Ag: NEGATIVE
RPR Ser Ql: NONREACTIVE

## 2019-05-10 LAB — HEPATITIS C ANTIBODY: Hep C Virus Ab: <0.1 s/co ratio (ref 0.0–0.9)

## 2019-05-10 LAB — HSV(HERPES SIMPLEX VRS) I + II AB-IGG
HSV 1 Glycoprotein G Ab, IgG: <0.91 index (ref 0.00–0.90)
HSV 2 IgG, Type Spec: 16.6 index — ABNORMAL HIGH (ref 0.00–0.90)

## 2019-05-11 ENCOUNTER — Telehealth: Payer: Self-pay | Admitting: Obstetrics and Gynecology

## 2019-05-11 NOTE — Telephone Encounter (Signed)
Patient would like to speak with nurse to go over test results.

## 2019-05-11 NOTE — Telephone Encounter (Signed)
Dr. Quincy Simmonds - please review 05/09/19 lab results and advise.

## 2019-05-11 NOTE — Telephone Encounter (Signed)
Spoke with patient with Glorianne Manchester, RN present. Results given. Patient verbalizes understanding of all results. All questions answered.  Routing to provider and will close encounter.

## 2019-05-11 NOTE — Telephone Encounter (Signed)
Please see result note.  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

## 2019-05-11 NOTE — Telephone Encounter (Signed)
-----   Message from Nunzio Cobbs, MD sent at 05/11/2019 11:09 AM EDT ----- Please contact patient with results of testing.  See also phone note.  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  This is anogenital herpes.She tested positive for long term antibodies and negative for short term antibodies.   She tested negative for HSV 1, which causes cold sores and also can cause anogenital lesions.   She tested negative for HIV, syphilis, hepatitis B and C, gonorrhea, chlamydia and trichomonas.

## 2019-06-02 IMAGING — MG DIGITAL DIAGNOSTIC UNILATERAL RIGHT MAMMOGRAM WITH TOMO AND CAD
6 series · 6 of 18 positions shown · non-contrast
Comparison: Previous exam(s).

CLINICAL DATA: 47-year-old female with a right breast palpable
abnormality.

EXAM:
DIGITAL DIAGNOSTIC UNILATERAL RIGHT MAMMOGRAM WITH CAD AND TOMO
RIGHT BREAST ULTRASOUND

[R TAN synth-2D]
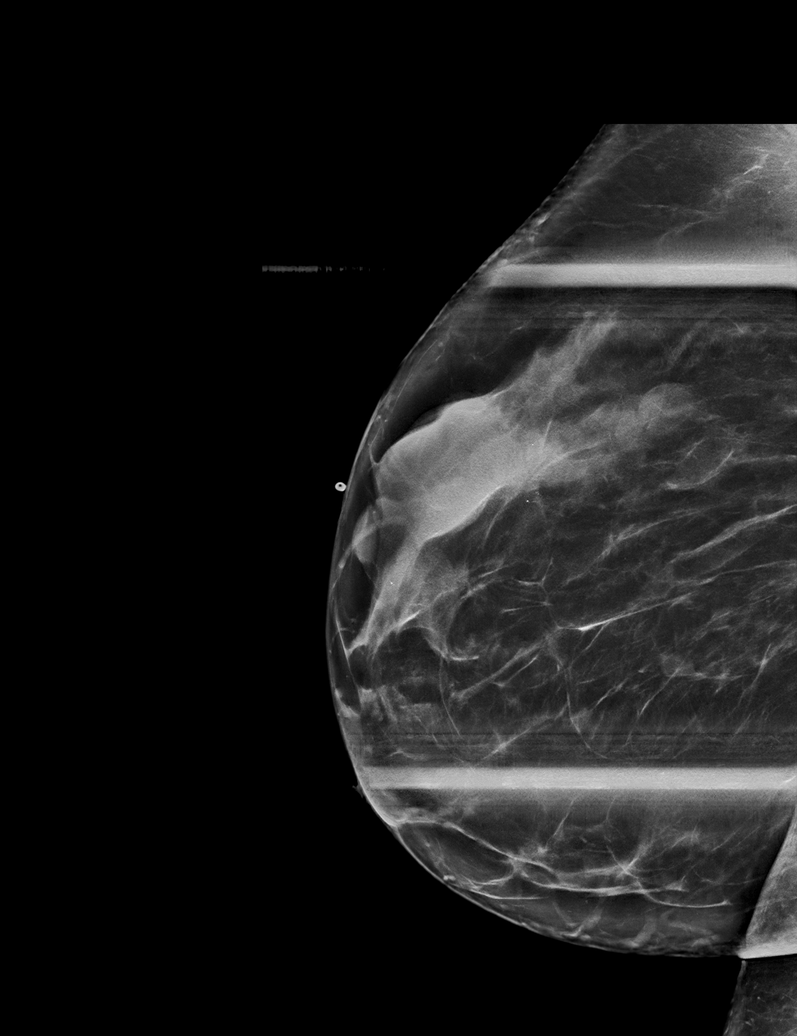

[R MLO synth-2D]
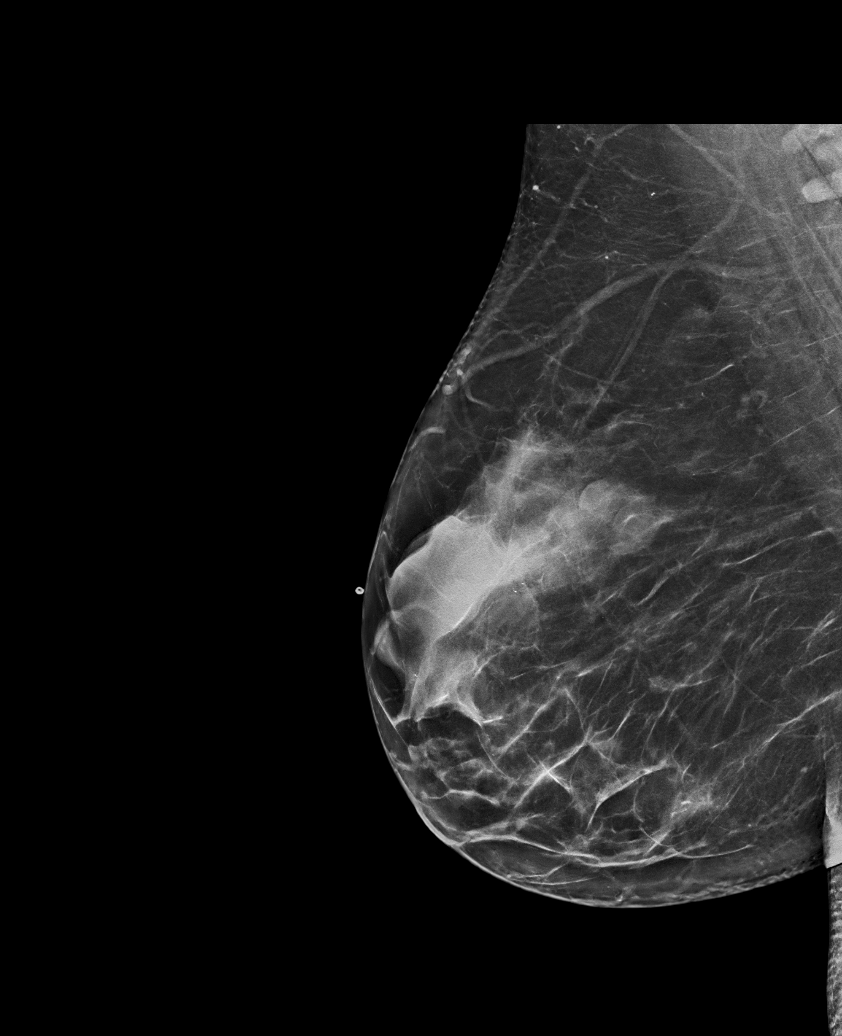

[R CC synth-2D]
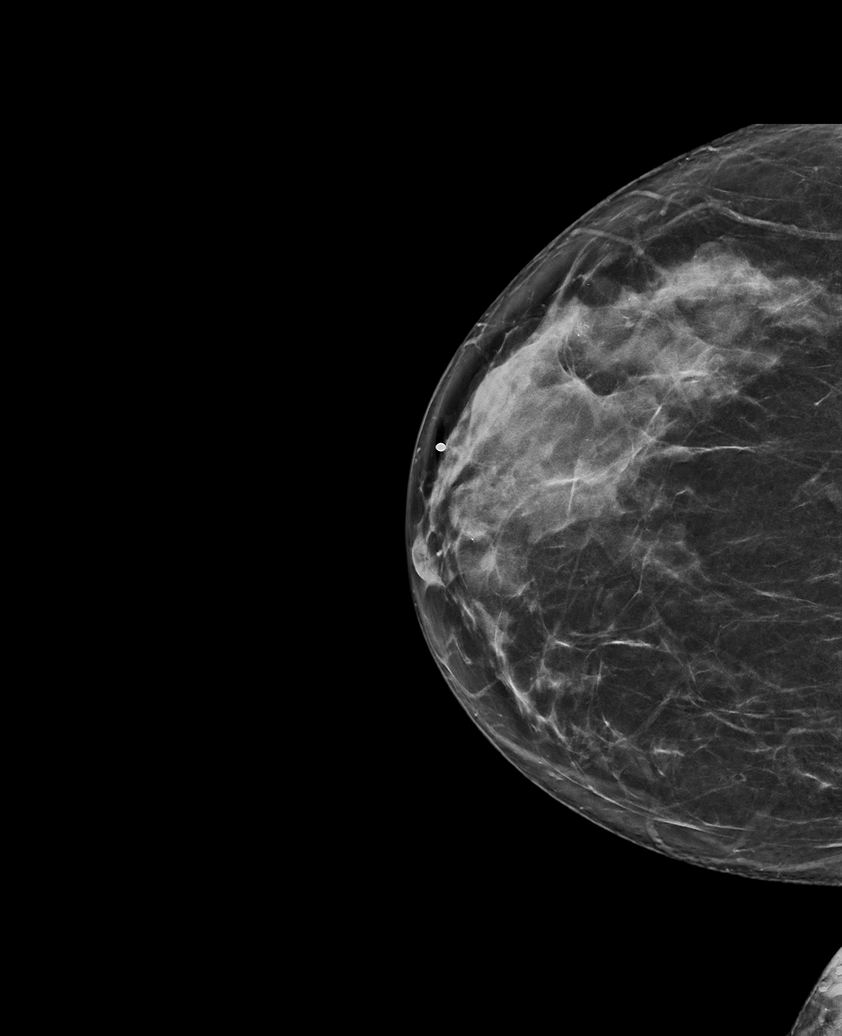

[R TAN tomo · tomo slice 41/82.0]
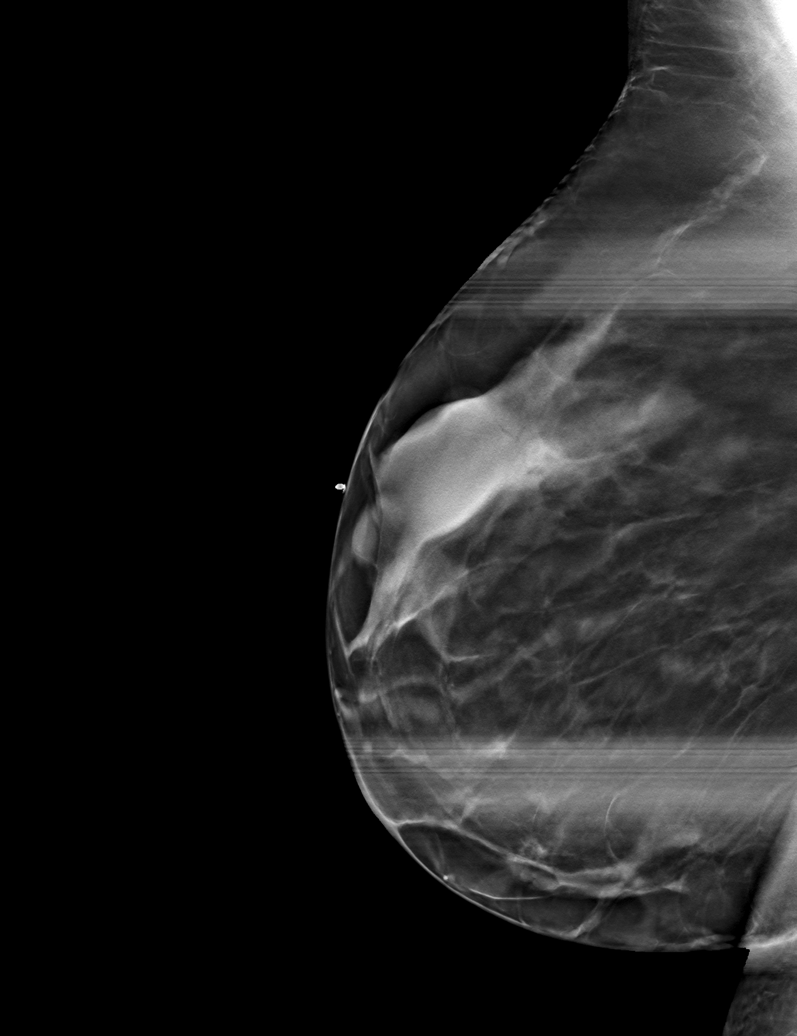

[R CC tomo · tomo slice 38/75.0]
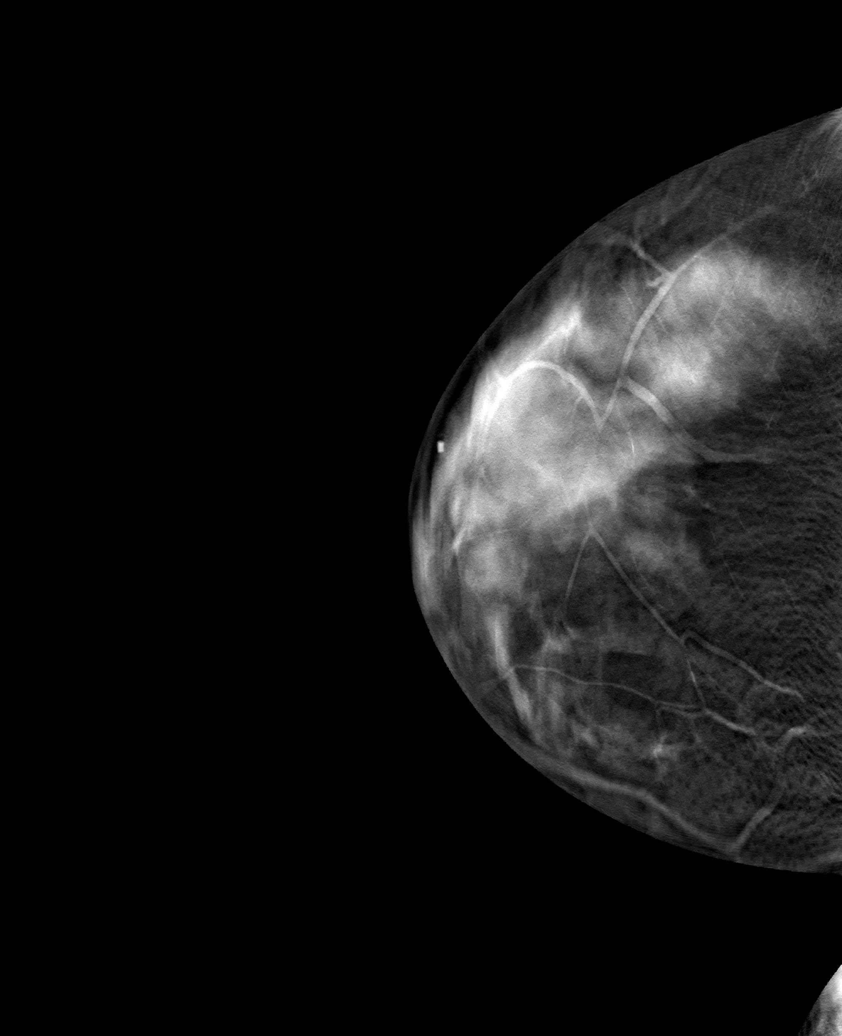

[R MLO tomo · tomo slice 44/87.0]
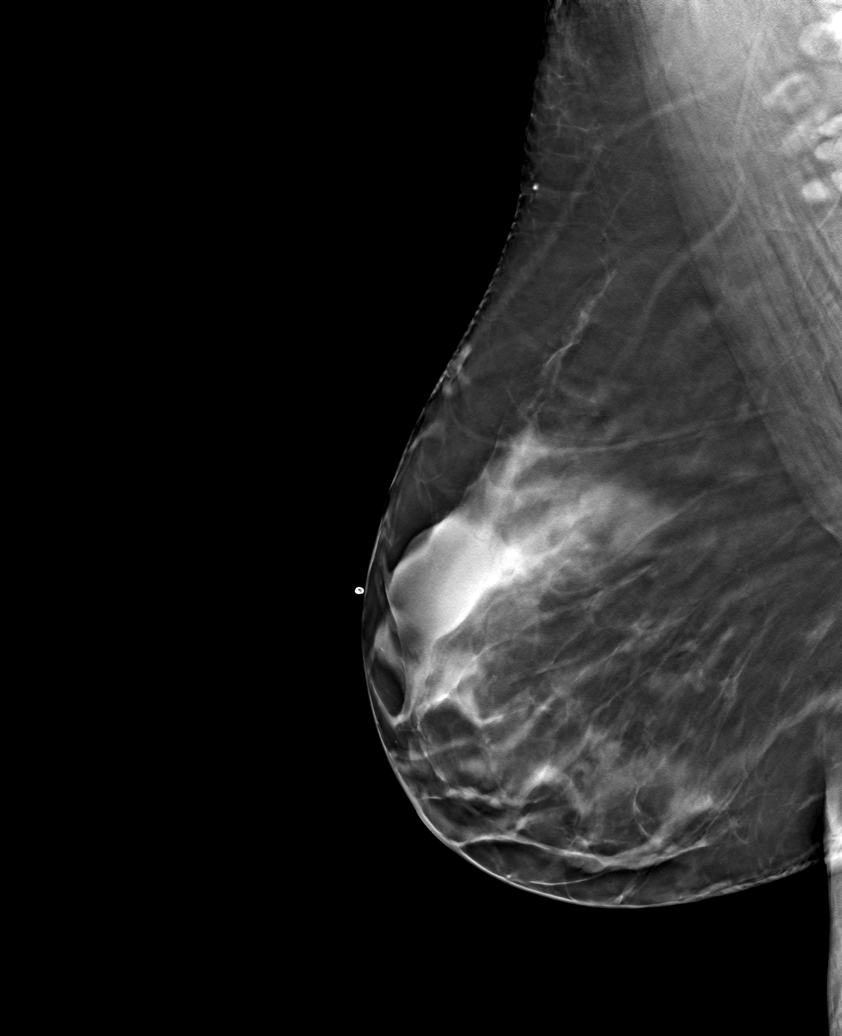

[6 of 18 positions shown; findings below may reference images not displayed]

ACR Breast Density Category c: The breast tissue is heterogeneously
dense, which may obscure small masses.
FINDINGS: There is an oval mass with obscured margins corresponding to the
area of palpable concern in the upper-outer right breast measuring
approximately 2.9 cm. Additional oval masses with obscured margins
are identified in the right breast suggestive of cysts.

Mammographic images were processed with CAD.

Physical examination reveals a firm mobile mass at the approximate
12 o'clock position.

Targeted ultrasound of the right breast was performed demonstrating
numerous cysts. The largest cyst site of palpable concern measures
3.5 x 2.1 x 3.9 cm. No suspicious masses or abnormality seen in the
superior right breast.
IMPRESSION: Right breast cysts.

RECOMMENDATION:
1. Annual routine screening mammography is recommended, due April 2018.

2. The patient is considering cyst aspiration for the largest cyst
in the right breast as this is causing discomfort. If the patient
decides on cyst aspiration she will call the [REDACTED] to
schedule this.

I have discussed the findings and recommendations with the patient.
Results were also provided in writing at the conclusion of the
visit. If applicable, a reminder letter will be sent to the patient
regarding the next appointment.

BI-RADS CATEGORY  2: Benign.

## 2019-07-31 ENCOUNTER — Other Ambulatory Visit: Payer: Self-pay

## 2019-08-01 ENCOUNTER — Ambulatory Visit (INDEPENDENT_AMBULATORY_CARE_PROVIDER_SITE_OTHER): Payer: BC Managed Care – PPO | Admitting: Obstetrics and Gynecology

## 2019-08-01 ENCOUNTER — Encounter: Payer: Self-pay | Admitting: Obstetrics and Gynecology

## 2019-08-01 VITALS — BP 110/66 | HR 90 | Temp 97.4°F | Ht 61.0 in | Wt 191.6 lb

## 2019-08-01 DIAGNOSIS — R1032 Left lower quadrant pain: Secondary | ICD-10-CM | POA: Diagnosis not present

## 2019-08-01 DIAGNOSIS — N921 Excessive and frequent menstruation with irregular cycle: Secondary | ICD-10-CM | POA: Diagnosis not present

## 2019-08-01 DIAGNOSIS — D219 Benign neoplasm of connective and other soft tissue, unspecified: Secondary | ICD-10-CM

## 2019-08-01 MED ORDER — MEDROXYPROGESTERONE ACETATE 10 MG PO TABS: 10.0000 mg | ORAL_TABLET | Freq: Every day | ORAL | 0 refills | Status: DC

## 2019-08-01 NOTE — Progress Notes (Signed)
GYNECOLOGY  VISIT   HPI: 49 y.o.   Single  African American/Indian  female   [phone] with Patient's last menstrual period was 07/27/2019 (exact date).   here for follow up.    Patient states began cycle on 07-27-19-- very heavy. This was first one in 4 months.  She passed a quarter size piece of tissue filled with blood.  Patient having lower mid pelvic pain with this cycle. She states passed large piece of "tissue" yesterday which was very bloody.  Woke up on Saturday night and was in a puddle of blood.  Used a tampon and long overnight pad.  Bleeding is lessened now.  Lower left abdominal pain also.  She had pain prior to the bleeding.  Some constipation, esp when she takes iron.   She did not tolerate Micronor. It caused mood swings and make her feel like she was not real.  She felt worse with Aygestin.   Prior US and sonohysterogram showed scalloped endometrium and EMB showed proliferative endometrium.     She has a tingling with urination.   E2 5.2 and FSH 36 on 08/05/18.   Taking a menopause vitamin from One a [employer]. This is helping her hot flashes and night sweats.   No change in sexual partner.  Some white discharge.  Denies HSV outbreaks.  Asking about Covid vaccine.   GYNECOLOGIC HISTORY: Patient's last menstrual period was 07/27/2019 (exact date). Contraception:  Tubal Menopausal hormone therapy:  none Last mammogram:  05-01-19 3D/Neg/Waxing and waning circumscribed equal density masses bilaterally are consistent with a changing cystic pattern/density C/BiRads2 Last pap smear: 04-28-18 ASCUS:Neg HR HPV,2017 normal per patient        OB History    Gravida  3   Para  2   Term  2   Preterm      AB  1   Living  2     SAB      TAB      Ectopic      Multiple      Live Births                 Patient Active Problem List   Diagnosis Date Noted  . Pelvic pain 11/06/2017  . Anxiety 08/05/2017  . Stress incontinence 05/20/2017  . Fibroids  04/02/2017    Past Medical History:  Diagnosis Date  . Anxiety   . Dysmenorrhea   . Fibroid   . Gastritis 2019  . GERD (gastroesophageal reflux disease)   . HSV-2 infection 2021  . Hyperlipidemia   . Urinary incontinence     Past Surgical History:  Procedure Laterality Date  . COLONOSCOPY WITH ESOPHAGOGASTRODUODENOSCOPY (EGD)  09/24/2017  . TUBAL LIGATION      Current Outpatient Medications  Medication Sig Dispense Refill  . Albuterol Sulfate (PROAIR RESPICLICK) 941 (90 Base) MCG/ACT AEPB Inhale 2 puffs into the lungs every 4 (four) hours. 1 each 0  . COCONUT OIL PO Take 1 capsule by mouth daily.    . Escitalopram Oxalate (LEXAPRO PO) Take 10 mg by mouth every morning.     . famotidine (PEPCID AC) 10 MG chewable tablet Chew 10 mg by mouth 2 (two) times daily.    Marland Kitchen FIBER PO Take by mouth 3 (three) times daily.    . fluticasone (FLONASE) 50 MCG/ACT nasal spray Place 1 spray into both nostrils daily. 16 g 0  . lidocaine (XYLOCAINE) 5 % ointment Apply 1 application topically 3 (three) times daily. 1.25 g 0  .  loratadine (CLARITIN) 10 MG tablet Take 10 mg by mouth daily.    . methocarbamol (ROBAXIN) 500 MG tablet Take 500-1,000 mg by mouth every 6 (six) hours as needed.    . Multiple Vitamins-Minerals (ONE-A-DAY MENOPAUSE FORMULA PO) Take 1 tablet by mouth daily.    . Omega-3 Fatty Acids (FISH OIL) 1000 MG CAPS Take 1 capsule by mouth daily.    Marland Kitchen POTASSIUM PO Take 1 tablet by mouth 3 (three) times a week. This is OTC strength    . Probiotic Product (PROBIOTIC PO) Take by mouth daily.    . Simethicone (GAS-X PO) Take 1 tablet by mouth as needed.    . triamcinolone (KENALOG) 0.025 % ointment Apply 1 application topically 2 (two) times daily. 30 g 0   No current facility-administered medications for this visit.     ALLERGIES: Peanut oil, Protonix [pantoprazole sodium], Doxycycline, Latex, and Penicillins  Family History  Problem Relation Age of Onset  . Thyroid disease Mother    . Lupus Mother   . Stroke Father        TIA  . Breast cancer Maternal Aunt   . Hypertension Maternal Grandmother     Social History   Socioeconomic History  . Marital status: Single    Spouse name: Not on file  . Number of children: Not on file  . Years of education: Not on file  . Highest education level: Not on file  Occupational History  . Not on file  Tobacco Use  . Smoking status: Current Every Day Smoker    Packs/day: 1.00    Types: Cigarettes  . Smokeless tobacco: Never Used  Substance and Sexual Activity  . Alcohol use: Yes    Alcohol/week: 1.0 standard drinks    Types: 1 Glasses of wine per week    Comment: social  . Drug use: No  . Sexual activity: Not Currently    Birth control/protection: Surgical    Comment: tubal  Other Topics Concern  . Not on file  Social History Narrative  . Not on file   Social Determinants of Health   Financial Resource Strain:   . Difficulty of Paying Living Expenses:   Food Insecurity:   . Worried About Charity fundraiser in the Last Year:   . Arboriculturist in the Last Year:   Transportation Needs:   . Film/video editor (Medical):   Marland Kitchen Lack of Transportation (Non-Medical):   Physical Activity:   . Days of Exercise per Week:   . Minutes of Exercise per Session:   Stress:   . Feeling of Stress :   Social Connections:   . Frequency of Communication with Friends and Family:   . Frequency of Social Gatherings with Friends and Family:   . Attends Religious Services:   . Active Member of Clubs or Organizations:   . Attends Archivist Meetings:   Marland Kitchen Marital Status:   Intimate Partner Violence:   . Fear of Current or Ex-Partner:   . Emotionally Abused:   Marland Kitchen Physically Abused:   . Sexually Abused:     Review of Systems  Genitourinary: Positive for pelvic pain.  All other systems reviewed and are negative.   PHYSICAL EXAMINATION:    BP 110/66 (Cuff Size: Large)   Pulse 90   Temp (!) 97.4 F (36.3 C)  (Temporal)   Ht 5\' 1"  (1.549 m)   Wt 191 lb 9.6 oz (86.9 kg)   LMP 07/27/2019 (Exact Date)  BMI 36.20 kg/m     General appearance: alert, cooperative and appears stated age   Pelvic: External genitalia:  no lesions              Urethra:  normal appearing urethra with no masses, tenderness or lesions              Bartholins and Skenes: normal                 Vagina: normal appearing vagina with normal color and discharge, no lesions              Cervix: no lesions.  Some dark blood.                 Bimanual Exam:  Uterus:  normal size, contour, position, consistency, mobility, non-tender              Adnexa: no mass, fullness, tenderness            Chaperone was present for exam.  ASSESSMENT  Perimenopausal female.  Menorrhagia.  Fibroids.  LLQ pain.  Status post BTL. Constipation.   PLAN  CBC.  Provera 10 mg x 10 days. Expect a menstruation following the completion of this.  If bleeding or pain persist, return for pelvic ultrasound.   An After Visit Summary was printed and given to the patient.  ___25___ minutes face to face time of which over 50% was spent in counseling.

## 2019-08-02 LAB — CBC
Hematocrit: 38.5 % (ref 34.0–46.6)
Hemoglobin: 12.6 g/dL (ref 11.1–15.9)
MCH: 28.3 pg (ref 26.6–33.0)
MCHC: 32.7 g/dL (ref 31.5–35.7)
MCV: 87 fL (ref 79–97)
Platelets: 255 10*3/uL (ref 150–450)
RBC: 4.45 x10E6/uL (ref 3.77–5.28)
RDW: 15.4 % (ref 11.7–15.4)
WBC: 9 10*3/uL (ref 3.4–10.8)

## 2020-03-26 ENCOUNTER — Other Ambulatory Visit: Payer: Self-pay | Admitting: Obstetrics and Gynecology

## 2020-03-26 DIAGNOSIS — Z1231 Encounter for screening mammogram for malignant neoplasm of breast: Secondary | ICD-10-CM

## 2020-05-02 ENCOUNTER — Ambulatory Visit: Payer: BC Managed Care – PPO | Admitting: Obstetrics and Gynecology

## 2020-05-10 ENCOUNTER — Ambulatory Visit
Admission: RE | Admit: 2020-05-10 | Discharge: 2020-05-10 | Disposition: A | Discharge status: Home / Self Care | Payer: BC Managed Care – PPO | Source: Ambulatory Visit | Attending: Obstetrics and Gynecology | Admitting: Obstetrics and Gynecology

## 2020-05-10 ENCOUNTER — Other Ambulatory Visit: Payer: Self-pay

## 2020-05-10 DIAGNOSIS — Z1231 Encounter for screening mammogram for malignant neoplasm of breast: Secondary | ICD-10-CM

## 2020-05-12 ENCOUNTER — Other Ambulatory Visit: Payer: Self-pay | Admitting: Obstetrics and Gynecology

## 2020-07-24 ENCOUNTER — Ambulatory Visit (INDEPENDENT_AMBULATORY_CARE_PROVIDER_SITE_OTHER): Payer: BC Managed Care – PPO | Admitting: Obstetrics and Gynecology

## 2020-07-24 ENCOUNTER — Encounter: Payer: Self-pay | Admitting: Obstetrics and Gynecology

## 2020-07-24 ENCOUNTER — Other Ambulatory Visit: Payer: Self-pay

## 2020-07-24 ENCOUNTER — Other Ambulatory Visit (HOSPITAL_COMMUNITY)
Admission: RE | Admit: 2020-07-24 | Discharge: 2020-07-24 | Disposition: A | Discharge status: Home / Self Care | Payer: BC Managed Care – PPO | Source: Ambulatory Visit | Attending: Obstetrics and Gynecology | Admitting: Obstetrics and Gynecology

## 2020-07-24 VITALS — BP 122/74 | HR 91 | Ht 61.0 in | Wt 177.0 lb

## 2020-07-24 DIAGNOSIS — Z113 Encounter for screening for infections with a predominantly sexual mode of transmission: Secondary | ICD-10-CM | POA: Diagnosis not present

## 2020-07-24 DIAGNOSIS — N951 Menopausal and female climacteric states: Secondary | ICD-10-CM | POA: Diagnosis not present

## 2020-07-24 DIAGNOSIS — Z01419 Encounter for gynecological examination (general) (routine) without abnormal findings: Secondary | ICD-10-CM | POA: Diagnosis not present

## 2020-07-24 NOTE — Patient Instructions (Signed)

## 2020-07-24 NOTE — Progress Notes (Signed)
50 y.o. [id number] Single African American/Indian female here for annual exam.    Wants full STD testing. No HSV outbreaks since her Covid vaccine.   Asking for routine lab work.   Lost 27 pounds through diet change and exercise.  No menses for one year.  Hot flashes at night.  Taking one a day menopause vitamin. Heat is manageable.  Has some constipation issues which she is trying to control with her diet, collagen, and metamucil. Has some bleeding if she strains.   Thinks she has hemorrhoids.   Has reflux.   [place] 36 on 08/05/18.  States she had an allergic reaction to her Covid vaccine.   Had swelling in her arm.  She was told this was due to her metal allergy and metal component of the vaccine.  She has an allergy to nickel.   PCP: [facility]   Patient's last menstrual period was 07/27/2019 (exact date).           Sexually active: No.  The current method of family planning is tubal ligation.    Exercising: Yes.    Treadmill and doing hoola hoop Smoker:  Yes, smokes 1ppd  Health Maintenance: Pap: 04-28-18 ASCUS:Neg HR HPV, 2017 normal per patient History of abnormal Pap:  no MMG: 05-10-20 3D/Neg/BiRads1 Colonoscopy:2019 polyps;next due --patient unsure, 5 - 10 years.  Dr. Collene Mares.   BMD:  n/a  Result  n/a TDaP:  03-20-15 Gardasil:   no HIV:12-22-18 NR Hep C:12-22-18 Neg Screening Labs:  PCP   reports that she has been smoking cigarettes. She has been smoking about 1.00 pack per day. She has never used smokeless tobacco. She reports current alcohol use of about 2.0 standard drinks of alcohol per week. She reports that she does not use drugs.  Past Medical History:  Diagnosis Date  . Anxiety   . Dysmenorrhea   . Fibroid   . Gastritis 2019  . GERD (gastroesophageal reflux disease)   . HSV-2 infection 2021  . Hyperlipidemia   . Urinary incontinence     Past Surgical History:  Procedure Laterality Date  . COLONOSCOPY WITH ESOPHAGOGASTRODUODENOSCOPY  (EGD)  09/24/2017  . TUBAL LIGATION      Current Outpatient Medications  Medication Sig Dispense Refill  . albuterol (VENTOLIN HFA) 108 (90 Base) MCG/ACT inhaler Inhale 2 puffs into the lungs 4 (four) times daily.    . COCONUT OIL PO Take 1 capsule by mouth daily.    . Escitalopram Oxalate (LEXAPRO PO) Take 10 mg by mouth every morning.     Marland Kitchen FIBER PO Take by mouth 3 (three) times daily.    . fluticasone (FLONASE) 50 MCG/ACT nasal spray Place 1 spray into both nostrils daily. 16 g 0  . lidocaine (XYLOCAINE) 5 % ointment Apply 1 application topically 3 (three) times daily. 1.25 g 0  . loratadine (CLARITIN) 10 MG tablet Take 10 mg by mouth daily.    . methocarbamol (ROBAXIN) 500 MG tablet Take 500-1,000 mg by mouth every 6 (six) hours as needed.    . Multiple Vitamins-Minerals (ONE-A-DAY MENOPAUSE FORMULA PO) Take 1 tablet by mouth daily.    . Omega-3 Fatty Acids (FISH OIL) 1000 MG CAPS Take 1 capsule by mouth daily.    . pantoprazole (PROTONIX) 40 MG tablet Take 1 tablet by mouth daily.    Marland Kitchen POTASSIUM PO Take 1 tablet by mouth 3 (three) times a week. This is OTC strength    . Probiotic Product (PROBIOTIC PO) Take by mouth daily.    Marland Kitchen  Simethicone (GAS-X PO) Take 1 tablet by mouth as needed.    . triamcinolone (KENALOG) 0.025 % ointment Apply 1 application topically 2 (two) times daily. 30 g 0   No current facility-administered medications for this visit.    Family History  Problem Relation Age of Onset  . Thyroid disease Mother   . Lupus Mother   . Stroke Father        TIA  . Breast cancer Maternal Aunt   . Hypertension Maternal Grandmother     Review of Systems  Gastrointestinal:       Hemorrhoids  All other systems reviewed and are negative.   Exam:   BP 122/74   Pulse 91   Ht 5\' 1"  (1.549 m)   Wt 177 lb (80.3 kg)   LMP 07/27/2019 (Exact Date)   SpO2 99%   BMI 33.44 kg/m     General appearance: alert, cooperative and appears stated age Head: normocephalic, without  obvious abnormality, atraumatic Neck: no adenopathy, supple, symmetrical, trachea midline and thyroid normal to inspection and palpation Lungs: clear to auscultation bilaterally Breasts: normal appearance, no masses or tenderness, No nipple retraction or dimpling, No nipple discharge or bleeding, No axillary adenopathy Heart: regular rate and rhythm Abdomen: soft, non-tender; no masses, no organomegaly Extremities: extremities normal, atraumatic, no cyanosis or edema Skin: skin color, texture, turgor normal. No rashes or lesions Lymph nodes: cervical, supraclavicular, and axillary nodes normal. Neurologic: grossly normal  Pelvic: External genitalia:  no lesions              No abnormal inguinal nodes palpated.              Urethra:  normal appearing urethra with no masses, tenderness or lesions              Bartholins and Skenes: normal                 Vagina: normal appearing vagina with normal color and discharge, no lesions              Cervix: no lesions              Pap taken: No. Bimanual Exam:  Uterus:  normal size, contour, position, consistency, mobility, non-tender              Adnexa: no mass, fullness, tenderness              Rectal exam: Yes.  .  Confirms.              Anus:  normal sphincter tone, hemorrhoids noted.   Chaperone was present for exam.  Assessment:   Well woman visit with normal exam. Status post BTL. Menopausal symptoms. Hx fibroids.  HSV 2 aby positive.  Constipation and hemorrhoids.  Successful weight loss.  Tobacco use.   Plan: Mammogram screening discussed. Self breast awareness reviewed. Pap and HR HPV as above. Guidelines for Calcium, Vitamin D, regular exercise program including cardiovascular and weight bearing exercise. STD testing.  Routine labs.  Will check FSH and estradiol.  We talked about gradual smoking cessation.  Ok to try Colace stool softener daily.  Follow up annually and prn.

## 2020-07-25 LAB — LIPID PANEL
Cholesterol: 203 mg/dL — ABNORMAL HIGH (ref <200)
HDL: 45 mg/dL — ABNORMAL LOW (ref >=50)
LDL Cholesterol (Calc): 119 mg/dL (calc) — ABNORMAL HIGH
Non-HDL Cholesterol (Calc): 158 mg/dL (calc) — ABNORMAL HIGH (ref <130)
Total CHOL/HDL Ratio: 4.5 (calc) (ref <5.0)
Triglycerides: 272 mg/dL — ABNORMAL HIGH (ref <150)

## 2020-07-25 LAB — HSV(HERPES SIMPLEX VRS) I + II AB-IGG
HAV 1 IGG,TYPE SPECIFIC AB: <0.9 index
HSV 2 IGG,TYPE SPECIFIC AB: 12.7 index — ABNORMAL HIGH

## 2020-07-25 LAB — CBC
HCT: 45.9 % — ABNORMAL HIGH (ref 35.0–45.0)
Hemoglobin: 14.9 g/dL (ref 11.7–15.5)
MCH: 28.9 pg (ref 27.0–33.0)
MCHC: 32.5 g/dL (ref 32.0–36.0)
MCV: 89.1 fL (ref 80.0–100.0)
MPV: 12.7 fL — ABNORMAL HIGH (ref 7.5–12.5)
Platelets: 238 10*3/uL (ref 140–400)
RBC: 5.15 10*6/uL — ABNORMAL HIGH (ref 3.80–5.10)
RDW: 13.4 % (ref 11.0–15.0)
WBC: 8.7 10*3/uL (ref 3.8–10.8)

## 2020-07-25 LAB — COMPREHENSIVE METABOLIC PANEL
AG Ratio: 2.3 (calc) (ref 1.0–2.5)
ALT: 16 U/L (ref 6–29)
AST: 17 U/L (ref 10–35)
Albumin: 4.5 g/dL (ref 3.6–5.1)
Alkaline phosphatase (APISO): 92 U/L (ref 31–125)
BUN: 16 mg/dL (ref 7–25)
CO2: 26 mmol/L (ref 20–32)
Calcium: 9.5 mg/dL (ref 8.6–10.2)
Chloride: 105 mmol/L (ref 98–110)
Creat: 0.61 mg/dL (ref 0.50–1.10)
Globulin: 2 g/dL (calc) (ref 1.9–3.7)
Glucose, Bld: 83 mg/dL (ref 65–99)
Potassium: 4.3 mmol/L (ref 3.5–5.3)
Sodium: 141 mmol/L (ref 135–146)
Total Bilirubin: 0.3 mg/dL (ref 0.2–1.2)
Total Protein: 6.5 g/dL (ref 6.1–8.1)

## 2020-07-25 LAB — CERVICOVAGINAL ANCILLARY ONLY
Chlamydia: NEGATIVE
Comment: NEGATIVE
Comment: NEGATIVE
Comment: NORMAL
Neisseria Gonorrhea: NEGATIVE
Trichomonas: NEGATIVE

## 2020-07-25 LAB — FOLLICLE STIMULATING HORMONE: FSH: 39 m[IU]/mL

## 2020-07-25 LAB — ESTRADIOL: Estradiol: <15 pg/mL

## 2020-07-25 LAB — RPR: RPR Ser Ql: NONREACTIVE

## 2020-07-25 LAB — HEPATITIS C ANTIBODY
Hepatitis C Ab: NONREACTIVE
SIGNAL TO CUT-OFF: 0.01 (ref <1.00)

## 2020-07-25 LAB — HIV ANTIBODY (ROUTINE TESTING W REFLEX): HIV 1&2 Ab, 4th Generation: NONREACTIVE

## 2020-07-25 LAB — HEPATITIS B SURFACE ANTIGEN: Hepatitis B Surface Ag: NONREACTIVE

## 2020-08-28 ENCOUNTER — Other Ambulatory Visit: Payer: Self-pay | Admitting: Obstetrics and Gynecology

## 2021-03-25 ENCOUNTER — Other Ambulatory Visit: Payer: Self-pay | Admitting: Obstetrics and Gynecology

## 2021-04-23 ENCOUNTER — Other Ambulatory Visit: Payer: Self-pay | Admitting: Obstetrics and Gynecology

## 2021-04-23 DIAGNOSIS — Z1231 Encounter for screening mammogram for malignant neoplasm of breast: Secondary | ICD-10-CM

## 2021-05-12 ENCOUNTER — Ambulatory Visit: Payer: BC Managed Care – PPO

## 2021-05-16 ENCOUNTER — Ambulatory Visit
Admission: RE | Admit: 2021-05-16 | Discharge: 2021-05-16 | Disposition: A | Discharge status: Home / Self Care | Payer: BC Managed Care – PPO | Source: Ambulatory Visit | Attending: Obstetrics and Gynecology | Admitting: Obstetrics and Gynecology

## 2021-05-16 ENCOUNTER — Ambulatory Visit: Payer: BC Managed Care – PPO

## 2021-05-16 DIAGNOSIS — Z1231 Encounter for screening mammogram for malignant neoplasm of breast: Secondary | ICD-10-CM | POA: Diagnosis not present

## 2021-06-09 DIAGNOSIS — L408 Other psoriasis: Secondary | ICD-10-CM | POA: Diagnosis not present

## 2021-06-09 DIAGNOSIS — D2371 Other benign neoplasm of skin of right lower limb, including hip: Secondary | ICD-10-CM | POA: Diagnosis not present

## 2021-06-09 DIAGNOSIS — D2372 Other benign neoplasm of skin of left lower limb, including hip: Secondary | ICD-10-CM | POA: Diagnosis not present

## 2021-06-09 DIAGNOSIS — L308 Other specified dermatitis: Secondary | ICD-10-CM | POA: Diagnosis not present

## 2021-06-09 DIAGNOSIS — D485 Neoplasm of uncertain behavior of skin: Secondary | ICD-10-CM | POA: Diagnosis not present

## 2021-06-20 ENCOUNTER — Encounter: Payer: Self-pay | Admitting: Obstetrics and Gynecology

## 2021-06-23 NOTE — Telephone Encounter (Signed)
Please contact patient in follow up to her postmenopausal bleeding.  ? ?I recommend an office visit with me.  ?

## 2021-07-28 NOTE — Progress Notes (Signed)
51 y.o. [id number] Single African American/indian female here for annual exam.    Had vaginal bleeding in April. It had been a year since she had prior bleeding.  Hx fibroids.   [place] 39 and estradiol < 15 on 07/24/20.   She desires STD screening.  She is having urinary hesitancy.  No dysuria  Has developed blisters on her hands and she saw dermatology.  Dx with Eczema.   She is dealing with anxiety and her Lexapro is not working well.  Having a hard time getting to her PCP due to the distance she needs to travel.  Patient's mom had hysterectomy recently due to mass on ovary, which was cancer per patient.  Maternal aunt had breast cancer.  PCP: [facility]    Patient's last menstrual period was 06/15/2021.           Sexually active: No.  The current method of family planning is tubal ligation.    Exercising: No.    Smoker:  yes  Health Maintenance: Pap:   04-28-18 ASCUS:Neg HR HPV, 2017 normal per patient  History of abnormal Pap:  No MMG:  05-16-21 Neg/Birads1 Colonoscopy:  2019 polyps;next due --patient unsure, 5 - 10 years.  Dr. Collene Mares.   BMD:   n/a  Result  n/a TDaP:  03-20-15 Gardasil:   no HIV:07-24-20  NR Hep C:07-24-20 Neg Screening Labs:  CBC today.  Cholesterol and CMP with PCP.   reports that she has been smoking cigarettes. She has been smoking an average of 1 pack per day. She has never used smokeless tobacco. She reports current alcohol use of about 2.0 standard drinks of alcohol per week. She reports that she does not use drugs.  Past Medical History:  Diagnosis Date   Anxiety    Dysmenorrhea    Fibroid    Gastritis 2019   GERD (gastroesophageal reflux disease)    HSV-2 infection 2021   Hyperlipidemia    Psoriasis    Urinary incontinence     Past Surgical History:  Procedure Laterality Date   COLONOSCOPY WITH ESOPHAGOGASTRODUODENOSCOPY (EGD)  09/24/2017   TUBAL LIGATION      Current Outpatient Medications  Medication Sig Dispense Refill    albuterol (VENTOLIN HFA) 108 (90 Base) MCG/ACT inhaler Inhale 2 puffs into the lungs 4 (four) times daily.     clobetasol ointment (TEMOVATE) 0.05 % SMARTSIG:sparingly Topical Twice Daily PRN     COCONUT OIL PO Take 1 capsule by mouth daily.     escitalopram (LEXAPRO) 10 MG tablet Take 15 mg by mouth daily.     FIBER PO Take by mouth 3 (three) times daily.     fluticasone (FLONASE) 50 MCG/ACT nasal spray Place 1 spray into both nostrils daily. 16 g 0   lidocaine (XYLOCAINE) 5 % ointment Apply 1 application topically 3 (three) times daily. 1.25 g 0   loratadine (CLARITIN) 10 MG tablet Take 10 mg by mouth daily.     methocarbamol (ROBAXIN) 500 MG tablet Take 500-1,000 mg by mouth every 6 (six) hours as needed.     Multiple Vitamins-Minerals (ONE-A-DAY MENOPAUSE FORMULA PO) Take 1 tablet by mouth daily.     Omega-3 Fatty Acids (FISH OIL) 1000 MG CAPS Take 1 capsule by mouth daily.     pantoprazole (PROTONIX) 40 MG tablet Take 1 tablet by mouth daily.     predniSONE (DELTASONE) 20 MG tablet Take 20 mg by mouth 2 (two) times daily.     Probiotic Product (PROBIOTIC PO) Take  by mouth daily.     Simethicone (GAS-X PO) Take 1 tablet by mouth as needed.     triamcinolone (KENALOG) 0.025 % ointment Apply 1 application topically 2 (two) times daily. 30 g 0   No current facility-administered medications for this visit.    Family History  Problem Relation Age of Onset   Thyroid disease Mother    Lupus Mother    Stroke Father        TIA   Breast cancer Maternal Aunt    Hypertension Maternal Grandmother     Review of Systems  Genitourinary:  Positive for dysuria and frequency.  All other systems reviewed and are negative.   Exam:   BP 120/70   Pulse (!) 108   Ht '5\' 1"'$  (1.549 m)   Wt 174 lb (78.9 kg)   LMP 06/15/2021 Comment: spotting x1 day only  SpO2 97%   BMI 32.88 kg/m     General appearance: alert, cooperative and appears stated age Head: normocephalic, without obvious abnormality,  atraumatic Neck: no adenopathy, supple, symmetrical, trachea midline and thyroid normal to inspection and palpation Lungs: clear to auscultation bilaterally Breasts: normal appearance, no masses or tenderness, No nipple retraction or dimpling, No nipple discharge or bleeding, No axillary adenopathy Heart: regular rate and rhythm Abdomen: soft, non-tender; no masses, no organomegaly Extremities: extremities normal, atraumatic, no cyanosis or edema Skin: skin color, texture, turgor normal. No rashes or lesions Lymph nodes: cervical, supraclavicular, and axillary nodes normal. Neurologic: grossly normal  Pelvic: External genitalia:  no lesions              No abnormal inguinal nodes palpated.              Urethra:  normal appearing urethra with no masses, tenderness or lesions              Bartholins and Skenes: normal                 Vagina: normal appearing vagina with normal color and discharge, no lesions              Cervix: no lesions              Pap taken: yes Bimanual Exam:  Uterus:  normal size, contour, position, consistency, mobility, non-tender              Adnexa: mildly tender bilateral adnexa, no masses.  ?fibrosis of right adnexa?              Rectal exam: yes.  Confirms.              Anus:  normal sphincter tone, no lesions  Chaperone was present for exam:  Estill Bamberg, CMA  Assessment:   Well woman visit with gynecologic exam. Status post BTL. Hx fibroids.  Hx ASCUS pap.  HSV 2 aby positive.  Tobacco use.  Urinary hesitancy. Postmenopausal bleeding. FH breast and ovarian cancer.   Plan: Mammogram screening discussed. Self breast awareness reviewed. Pap and HR HPV collected.  Guidelines for Calcium, Vitamin D, regular exercise program including cardiovascular and weight bearing exercise. Urinalysis and culture.  FSH and estradiol.  STD screening.  CBC. Pelvic US and possible endometrial biopsy. We discussed genetic counseling and testing.   Patient will  consider.  List of [facility] providers and counselors to patient.  Follow up annually and prn.   After visit summary provided.

## 2021-07-31 ENCOUNTER — Ambulatory Visit (INDEPENDENT_AMBULATORY_CARE_PROVIDER_SITE_OTHER): Payer: BC Managed Care – PPO | Admitting: Obstetrics and Gynecology

## 2021-07-31 ENCOUNTER — Other Ambulatory Visit (HOSPITAL_COMMUNITY)
Admission: RE | Admit: 2021-07-31 | Discharge: 2021-07-31 | Disposition: A | Discharge status: Home / Self Care | Payer: BC Managed Care – PPO | Source: Ambulatory Visit | Attending: Obstetrics and Gynecology | Admitting: Obstetrics and Gynecology

## 2021-07-31 ENCOUNTER — Encounter: Payer: Self-pay | Admitting: Obstetrics and Gynecology

## 2021-07-31 VITALS — BP 120/70 | HR 108 | Ht 61.0 in | Wt 174.0 lb

## 2021-07-31 DIAGNOSIS — Z01419 Encounter for gynecological examination (general) (routine) without abnormal findings: Secondary | ICD-10-CM | POA: Diagnosis not present

## 2021-07-31 DIAGNOSIS — Z1159 Encounter for screening for other viral diseases: Secondary | ICD-10-CM | POA: Diagnosis not present

## 2021-07-31 DIAGNOSIS — Z0000 Encounter for general adult medical examination without abnormal findings: Secondary | ICD-10-CM

## 2021-07-31 DIAGNOSIS — N950 Postmenopausal bleeding: Secondary | ICD-10-CM

## 2021-07-31 DIAGNOSIS — Z113 Encounter for screening for infections with a predominantly sexual mode of transmission: Secondary | ICD-10-CM | POA: Diagnosis not present

## 2021-07-31 DIAGNOSIS — R3911 Hesitancy of micturition: Secondary | ICD-10-CM

## 2021-07-31 DIAGNOSIS — Z124 Encounter for screening for malignant neoplasm of cervix: Secondary | ICD-10-CM | POA: Diagnosis not present

## 2021-07-31 DIAGNOSIS — R300 Dysuria: Secondary | ICD-10-CM | POA: Diagnosis not present

## 2021-07-31 DIAGNOSIS — Z114 Encounter for screening for human immunodeficiency virus [HIV]: Secondary | ICD-10-CM

## 2021-07-31 DIAGNOSIS — E785 Hyperlipidemia, unspecified: Secondary | ICD-10-CM | POA: Diagnosis not present

## 2021-07-31 NOTE — Patient Instructions (Signed)

## 2021-08-01 LAB — CBC
HCT: 44.2 % (ref 35.0–45.0)
Hemoglobin: 14.6 g/dL (ref 11.7–15.5)
MCH: 28.3 pg (ref 27.0–33.0)
MCHC: 33 g/dL (ref 32.0–36.0)
MCV: 85.7 fL (ref 80.0–100.0)
MPV: 12.5 fL (ref 7.5–12.5)
Platelets: 252 10*3/uL (ref 140–400)
RBC: 5.16 10*6/uL — ABNORMAL HIGH (ref 3.80–5.10)
RDW: 12.7 % (ref 11.0–15.0)
WBC: 6.2 10*3/uL (ref 3.8–10.8)

## 2021-08-01 LAB — FOLLICLE STIMULATING HORMONE: FSH: 37.8 m[IU]/mL

## 2021-08-01 LAB — ESTRADIOL: Estradiol: <15 pg/mL

## 2021-08-01 LAB — RPR: RPR Ser Ql: NONREACTIVE

## 2021-08-01 LAB — HEPATITIS C ANTIBODY
Hepatitis C Ab: NONREACTIVE
SIGNAL TO CUT-OFF: 0.11 (ref <1.00)

## 2021-08-01 LAB — HIV ANTIBODY (ROUTINE TESTING W REFLEX): HIV 1&2 Ab, 4th Generation: NONREACTIVE

## 2021-08-02 LAB — URINALYSIS, COMPLETE W/RFL CULTURE
Bilirubin Urine: NEGATIVE
Glucose, UA: NEGATIVE
Hyaline Cast: NONE SEEN /LPF
Ketones, ur: NEGATIVE
Leukocyte Esterase: NEGATIVE
Nitrites, Initial: NEGATIVE
Protein, ur: NEGATIVE
Specific Gravity, Urine: 1.003 (ref 1.001–1.035)
pH: 6.5 (ref 5.0–8.0)

## 2021-08-02 LAB — URINE CULTURE
MICRO NUMBER:: 13500380
Result:: NO GROWTH
SPECIMEN QUALITY:: ADEQUATE

## 2021-08-02 LAB — CULTURE INDICATED

## 2021-08-04 LAB — CYTOLOGY - PAP
Chlamydia: NEGATIVE
Comment: NEGATIVE
Comment: NEGATIVE
Comment: NEGATIVE
Comment: NORMAL
Diagnosis: NEGATIVE
High risk HPV: NEGATIVE
Neisseria Gonorrhea: NEGATIVE
Trichomonas: NEGATIVE

## 2021-09-10 NOTE — Progress Notes (Signed)
GYNECOLOGY  VISIT   HPI: 51 y.o.   Single  African American/Indian  female   [phone] with No LMP recorded. Patient is perimenopausal.   here for pelvic ultrasound and possible EMB for postmenopausal bleeding.   Bled in April, 2023.  No bleeding for one year prior to that.  Hx fibroids.  [place] 39 and estradiol < 15 on 07/24/20.  FHS 37.8 and estradiol <15 on 07/31/21.   Atopic dermatitis of hands and feet.  Clobetasol not helping.  Wants triamcinolone.   Has appointment with dermatology in August and cannot get in sooner.   GYNECOLOGIC HISTORY: No LMP recorded. Patient is perimenopausal. Contraception:  Tubal Menopausal hormone therapy:  none Last mammogram:  05-16-21 Neg/Birads1 Last pap smear:  07-31-21 Neg:Neg HR HPV, 04-28-18 ASCUS:Neg HR HPV, 2017 normal per patient         OB History     Gravida  3   Para  2   Term  2   Preterm      AB  1   Living  2      SAB      IAB      Ectopic      Multiple      Live Births                 Patient Active Problem List   Diagnosis Date Noted   Pelvic pain 11/06/2017   Anxiety 08/05/2017   Stress incontinence 05/20/2017   Fibroids 04/02/2017    Past Medical History:  Diagnosis Date   Anxiety    Dysmenorrhea    Fibroid    Gastritis 2019   GERD (gastroesophageal reflux disease)    HSV-2 infection 2021   Hyperlipidemia    Psoriasis    Urinary incontinence     Past Surgical History:  Procedure Laterality Date   COLONOSCOPY WITH ESOPHAGOGASTRODUODENOSCOPY (EGD)  09/24/2017   TUBAL LIGATION      Current Outpatient Medications  Medication Sig Dispense Refill   albuterol (VENTOLIN HFA) 108 (90 Base) MCG/ACT inhaler Inhale 2 puffs into the lungs 4 (four) times daily.     COCONUT OIL PO Take 1 capsule by mouth daily.     escitalopram (LEXAPRO) 10 MG tablet Take 15 mg by mouth daily.     FIBER PO Take by mouth 3 (three) times daily.     fluticasone (FLONASE) 50 MCG/ACT nasal spray Place 1 spray into both  nostrils daily. 16 g 0   lidocaine (XYLOCAINE) 5 % ointment Apply 1 application topically 3 (three) times daily. 1.25 g 0   loratadine (CLARITIN) 10 MG tablet Take 10 mg by mouth daily.     Multiple Vitamins-Minerals (ONE-A-DAY MENOPAUSE FORMULA PO) Take 1 tablet by mouth daily.     Omega-3 Fatty Acids (FISH OIL) 1000 MG CAPS Take 1 capsule by mouth daily.     pantoprazole (PROTONIX) 40 MG tablet Take 1 tablet by mouth daily.     Probiotic Product (PROBIOTIC PO) Take by mouth daily.     Simethicone (GAS-X PO) Take 1 tablet by mouth as needed.     triamcinolone (KENALOG) 0.025 % ointment Apply 1 application topically 2 (two) times daily. (Patient not taking: Reported on 09/11/2021) 30 g 0   No current facility-administered medications for this visit.     ALLERGIES: Nickel, Peanut oil, Protonix [pantoprazole sodium], Doxycycline, Latex, and Penicillins  Family History  Problem Relation Age of Onset   Thyroid disease Mother    Lupus Mother  Stroke Father        TIA   Breast cancer Maternal Aunt    Hypertension Maternal Grandmother     Social History   Socioeconomic History   Marital status: Single    Spouse name: Not on file   Number of children: Not on file   Years of education: Not on file   Highest education level: Not on file  Occupational History   Not on file  Tobacco Use   Smoking status: Every Day    Packs/day: 1.00    Types: Cigarettes   Smokeless tobacco: Never  Vaping Use   Vaping Use: Never used  Substance and Sexual Activity   Alcohol use: Yes    Alcohol/week: 2.0 standard drinks of alcohol    Types: 2 Glasses of wine per week    Comment: social   Drug use: No   Sexual activity: Not Currently    Birth control/protection: Surgical    Comment: tubal  Other Topics Concern   Not on file  Social History Narrative   Not on file   Social Determinants of Health   Financial Resource Strain: Not on file  Food Insecurity: Not on file  Transportation Needs:  Not on file  Physical Activity: Not on file  Stress: Not on file  Social Connections: Not on file  Intimate Partner Violence: Not on file    Review of Systems  All other systems reviewed and are negative.   PHYSICAL EXAMINATION:    BP 92/62   Pulse (!) 104   SpO2 93%     General appearance: alert, cooperative and appears stated age  Pelvic US uterus 9.41 x 7.13 x 7.2 cm.  Multiple fibroids (9):  1.64 cm, 1.33 cm, 2.88 cm, 1.48 cm, 3.66 cm, 1.94 cm, 1.92 cm, 1.26 cm, 1.64 cm.  Largest fibroid is partially submucous.  EMS 3.86 cm.  Asymmetric.  Fundal portion is obscured by the partially submucous fibroid. Submucosal fibroid 3.7 x 3.5 cm.  Right ovary:  2.32 x 1.25 x 1.00 cm Left ovary:  2.61 x 0.80 x 1.03 cm. No adnexal masses.  No free fluid.  EMB to 7 cm.   Betadine prep. Paracervical block with 10 cc 1% lidocaine.  Lot [id number], exp 02/24/23. Tenaculum to anterior cervix.  Pipelle passed x 2.  Tissue to pathology.  No complications.  Minimal EBL.  Chaperone was present for exam:  Lovena Le, CMA  ASSESSMENT  Postmenopausal bleeding.  Status post BTL. Hx fibroids.  FH breast and ovarian cancer.    Rash of hands and feet.   PLAN  [place] images and report reviewed. Comparison done with prior pelvic US 04/22/17. Fu endometrial biopsy.   Rx for triamcinolone.    An After Visit Summary was printed and given to the patient.  30 min  total time was spent for this patient encounter, including preparation, face-to-face counseling with the patient, coordination of care, and documentation of the encounter in addition to endometrial biopsy.

## 2021-09-11 ENCOUNTER — Other Ambulatory Visit (HOSPITAL_COMMUNITY)
Admission: RE | Admit: 2021-09-11 | Discharge: 2021-09-11 | Disposition: A | Discharge status: Home / Self Care | Payer: BC Managed Care – PPO | Source: Ambulatory Visit | Attending: Obstetrics and Gynecology | Admitting: Obstetrics and Gynecology

## 2021-09-11 ENCOUNTER — Encounter: Payer: Self-pay | Admitting: Obstetrics and Gynecology

## 2021-09-11 ENCOUNTER — Ambulatory Visit (INDEPENDENT_AMBULATORY_CARE_PROVIDER_SITE_OTHER): Payer: BC Managed Care – PPO

## 2021-09-11 ENCOUNTER — Ambulatory Visit (INDEPENDENT_AMBULATORY_CARE_PROVIDER_SITE_OTHER): Payer: BC Managed Care – PPO | Admitting: Obstetrics and Gynecology

## 2021-09-11 VITALS — BP 92/62 | HR 104

## 2021-09-11 DIAGNOSIS — D219 Benign neoplasm of connective and other soft tissue, unspecified: Secondary | ICD-10-CM

## 2021-09-11 DIAGNOSIS — N950 Postmenopausal bleeding: Secondary | ICD-10-CM

## 2021-09-11 DIAGNOSIS — R21 Rash and other nonspecific skin eruption: Secondary | ICD-10-CM

## 2021-09-11 DIAGNOSIS — M793 Panniculitis, unspecified: Secondary | ICD-10-CM | POA: Diagnosis not present

## 2021-09-11 DIAGNOSIS — N858 Other specified noninflammatory disorders of uterus: Secondary | ICD-10-CM | POA: Diagnosis not present

## 2021-09-11 MED ORDER — TRIAMCINOLONE ACETONIDE 0.025 % EX OINT: 1.0000 | TOPICAL_OINTMENT | Freq: Two times a day (BID) | CUTANEOUS | 0 refills | Status: DC

## 2021-09-11 NOTE — Patient Instructions (Signed)
Endometrial Biopsy  An endometrial biopsy is a procedure to remove tissue samples from the endometrium, which is the lining of the uterus. The tissue that is removed can then be checked under a microscope for disease. This procedure is used to diagnose conditions such as endometrial cancer, endometrial tuberculosis, polyps, or other inflammatory conditions. This procedure may also be used to investigate uterine bleeding to determine where you are in your menstrual cycle or how your hormone levels are affecting the lining of the uterus. Tell a health care provider about: Any allergies you have. All medicines you are taking, including vitamins, herbs, eye drops, creams, and over-the-counter medicines. Any problems you or family members have had with anesthetic medicines. Any blood disorders you have. Any surgeries you have had. Any medical conditions you have. Whether you are pregnant or may be pregnant. What are the risks? Generally, this is a safe procedure. However, problems may occur, including: Bleeding. Pelvic infection. Puncture of the wall of the uterus with the biopsy device (rare). Allergic reactions to medicines. What happens before the procedure? Keep a record of your menstrual cycles as told by your health care provider. You may need to schedule your procedure for a specific time in your cycle. You may want to bring a sanitary pad to wear after the procedure. Plan to have someone take you home from the hospital or clinic. Ask your health care provider about: Changing or stopping your regular medicines. This is especially important if you are taking diabetes medicines, arthritis medicines, or blood thinners. Taking medicines such as aspirin and ibuprofen. These medicines can thin your blood. Do not take these medicines unless your health care provider tells you to take them. Taking over-the-counter medicines, vitamins, herbs, and supplements. What happens during the  procedure? You will lie on an exam table with your feet and legs supported as in a pelvic exam. Your health care provider will insert an instrument (speculum) into your vagina to see your cervix. Your cervix will be cleansed with an antiseptic solution. A medicine (local anesthetic) will be used to numb the cervix. A forceps instrument (tenaculum) will be used to hold your cervix steady for the biopsy. A thin, rod-like instrument (uterine sound) will be inserted through your cervix to determine the length of your uterus and the location where the biopsy sample will be removed. A thin, flexible tube (catheter) will be inserted through your cervix and into the uterus. The catheter will be used to collect the biopsy sample from your endometrial tissue. The catheter and speculum will then be removed, and the tissue sample will be sent to a lab for examination. The procedure may vary among health care providers and hospitals. What can I expect after procedure? You will rest in a recovery area until you are ready to go home. You may have mild cramping and a small amount of vaginal bleeding. This is normal. You may have a small amount of vaginal bleeding for a few days. This is normal. It is up to you to get the results of your procedure. Ask your health care provider, or the department that is doing the procedure, when your results will be ready. Follow these instructions at home: Take over-the-counter and prescription medicines only as told by your health care provider. Do not douche, use tampons, or have sexual intercourse until your health care provider approves. Return to your normal activities as told by your health care provider. Ask your health care provider what activities are safe for you. Follow   instructions from your health care provider about any activity restrictions, such as restrictions on strenuous exercise or heavy lifting. Keep all follow-up visits. This is important. Contact a  health care provider: You have heavy bleeding, or bleed for longer than 2 days after the procedure. You have bad smelling discharge from your vagina. You have a fever or chills. You have a burning sensation when urinating or you have difficulty urinating. You have severe pain in your lower abdomen. Get help right away if you: You have severe cramps in your stomach or back. You pass large blood clots. Your bleeding increases. You become weak or light-headed, or you faint or lose consciousness. Summary An endometrial biopsy is a procedure to remove tissue samples is taken from the endometrium, which is the lining of the uterus. The tissue sample that is removed will be checked under a microscope for disease. This procedure is used to diagnose conditions such as endometrial cancer, endometrial tuberculosis, polyps, or other inflammatory conditions. After the procedure, it is common to have mild cramping and a small amount of vaginal bleeding for a few days. Do not douche, use tampons, or have sexual intercourse until your health care provider approves. Ask your health care provider which activities are safe for you. This information is not intended to replace advice given to you by your health care provider. Make sure you discuss any questions you have with your health care provider. Document Revised: 01/06/2021 Document Reviewed: 09/04/2019 Elsevier Patient Education  2023 [employer]  

## 2021-09-15 LAB — SURGICAL PATHOLOGY

## 2021-09-17 ENCOUNTER — Other Ambulatory Visit (HOSPITAL_BASED_OUTPATIENT_CLINIC_OR_DEPARTMENT_OTHER): Payer: Self-pay

## 2021-09-17 ENCOUNTER — Encounter (HOSPITAL_BASED_OUTPATIENT_CLINIC_OR_DEPARTMENT_OTHER): Payer: Self-pay | Admitting: Emergency Medicine

## 2021-09-17 ENCOUNTER — Emergency Department (HOSPITAL_BASED_OUTPATIENT_CLINIC_OR_DEPARTMENT_OTHER)
Admission: EM | Admit: 2021-09-17 | Discharge: 2021-09-17 | Disposition: A | Discharge status: Home / Self Care | Payer: BC Managed Care – PPO | Attending: Emergency Medicine | Admitting: Emergency Medicine

## 2021-09-17 DIAGNOSIS — L408 Other psoriasis: Secondary | ICD-10-CM | POA: Diagnosis not present

## 2021-09-17 DIAGNOSIS — K5900 Constipation, unspecified: Secondary | ICD-10-CM | POA: Diagnosis not present

## 2021-09-17 DIAGNOSIS — Z7951 Long term (current) use of inhaled steroids: Secondary | ICD-10-CM | POA: Insufficient documentation

## 2021-09-17 DIAGNOSIS — R21 Rash and other nonspecific skin eruption: Secondary | ICD-10-CM | POA: Diagnosis not present

## 2021-09-17 DIAGNOSIS — L409 Psoriasis, unspecified: Secondary | ICD-10-CM | POA: Diagnosis not present

## 2021-09-17 DIAGNOSIS — Z91040 Latex allergy status: Secondary | ICD-10-CM | POA: Diagnosis not present

## 2021-09-17 DIAGNOSIS — Z91010 Allergy to peanuts: Secondary | ICD-10-CM | POA: Diagnosis not present

## 2021-09-17 LAB — CBC WITH DIFFERENTIAL/PLATELET
Abs Immature Granulocytes: 0.01 10*3/uL (ref 0.00–0.07)
Basophils Absolute: 0 10*3/uL (ref 0.0–0.1)
Basophils Relative: 1 %
Eosinophils Absolute: 0.1 10*3/uL (ref 0.0–0.5)
Eosinophils Relative: 1 %
HCT: 43.2 % (ref 36.0–46.0)
Hemoglobin: 14.2 g/dL (ref 12.0–15.0)
Immature Granulocytes: 0 %
Lymphocytes Relative: 20 %
Lymphs Abs: 1.3 10*3/uL (ref 0.7–4.0)
MCH: 27.7 pg (ref 26.0–34.0)
MCHC: 32.9 g/dL (ref 30.0–36.0)
MCV: 84.4 fL (ref 80.0–100.0)
Monocytes Absolute: 0.6 10*3/uL (ref 0.1–1.0)
Monocytes Relative: 9 %
Neutro Abs: 4.5 10*3/uL (ref 1.7–7.7)
Neutrophils Relative %: 69 %
Platelets: 253 10*3/uL (ref 150–400)
RBC: 5.12 MIL/uL — ABNORMAL HIGH (ref 3.87–5.11)
RDW: 13.5 % (ref 11.5–15.5)
WBC: 6.4 10*3/uL (ref 4.0–10.5)
nRBC: 0 % (ref 0.0–0.2)

## 2021-09-17 LAB — HEPATIC FUNCTION PANEL
ALT: 39 U/L (ref 0–44)
AST: 33 U/L (ref 15–41)
Albumin: 3.6 g/dL (ref 3.5–5.0)
Alkaline Phosphatase: 86 U/L (ref 38–126)
Bilirubin, Direct: <0.1 mg/dL (ref 0.0–0.2)
Total Bilirubin: 0.6 mg/dL (ref 0.3–1.2)
Total Protein: 6.7 g/dL (ref 6.5–8.1)

## 2021-09-17 LAB — BASIC METABOLIC PANEL
Anion gap: 7 (ref 5–15)
BUN: 9 mg/dL (ref 6–20)
CO2: 26 mmol/L (ref 22–32)
Calcium: 9.1 mg/dL (ref 8.9–10.3)
Chloride: 106 mmol/L (ref 98–111)
Creatinine, Ser: 0.47 mg/dL (ref 0.44–1.00)
GFR, Estimated: >60 mL/min (ref >60)
Glucose, Bld: 116 mg/dL — ABNORMAL HIGH (ref 70–99)
Potassium: 3.9 mmol/L (ref 3.5–5.1)
Sodium: 139 mmol/L (ref 135–145)

## 2021-09-17 LAB — HIV ANTIBODY (ROUTINE TESTING W REFLEX): HIV Screen 4th Generation wRfx: NONREACTIVE

## 2021-09-17 MED ORDER — PREDNISONE 10 MG PO TABS
40.0000 mg | ORAL_TABLET | Freq: Every day | ORAL | 0 refills | Status: AC
  Filled 2021-09-17: qty 20, 5d supply, fill #0

## 2021-09-17 MED ORDER — GLYCERIN (ADULT) 2 G RE SUPP
1.0000 | RECTAL | 0 refills | Status: DC | PRN
  Filled 2021-09-17: qty 12, 6d supply, fill #0

## 2021-09-17 MED ORDER — GLYCERIN (LAXATIVE) 2.1 G RE SUPP
1.0000 | Freq: Once | RECTAL | Status: AC
  Administered 2021-09-17: 1 via RECTAL
  Filled 2021-09-17: qty 1

## 2021-09-17 MED ORDER — SULFAMETHOXAZOLE-TRIMETHOPRIM 800-160 MG PO TABS
1.0000 | ORAL_TABLET | Freq: Two times a day (BID) | ORAL | 0 refills | Status: AC
  Filled 2021-09-17: qty 14, 7d supply, fill #0

## 2021-09-17 NOTE — ED Triage Notes (Signed)
Pt reports rash on hands and feet. Also having midline lower back pain. Pt concerned for constipation. Had diarrhea yesterday. No n/v/d.

## 2021-09-17 NOTE — ED Provider Notes (Signed)
[facility] EMERGENCY DEPARTMENT Provider Note   CSN: [id number] Arrival date & time: 09/17/21  3149     History  Chief Complaint  Patient presents with   Back Pain   Rash    Ruth Kennedy is a 51 y.o. female.   Back Pain Rash   51 year old female with a medical history significant for biopsy proven psoriasis, HSV-2, HLD, anxiety, GERD who presents to the emergency department with a generalized pustular rash on her palms and soles in addition to concern for constipation.  The patient states that she has had some abdominal cramping.  She took milk of magnesia and had a loose stool yesterday.  Her last bowel movement was yesterday.  She states that she is passing gas.  She denies any hematochezia or melena.  She has had a pustular rash along her palms and soles for the last 2 weeks.  She has not been sexually active for the last 2 years.  The rash is erythematous with some excoriations and a pustular eruption.  Home Medications Prior to Admission medications   Medication Sig Start Date End Date Taking? Authorizing Provider  glycerin adult 2 g suppository Place 1 suppository rectally as needed for constipation. 09/17/21  Yes Regan Lemming, MD  predniSONE (DELTASONE) 10 MG tablet Take 4 tablets (40 mg total) by mouth daily for 5 days. 09/17/21 09/22/21 Yes Regan Lemming, MD  sulfamethoxazole-trimethoprim (BACTRIM DS) 800-160 MG tablet Take 1 tablet by mouth 2 (two) times daily for 7 days. 09/17/21 09/24/21 Yes Regan Lemming, MD  albuterol (VENTOLIN HFA) 108 (90 Base) MCG/ACT inhaler Inhale 2 puffs into the lungs 4 (four) times daily. 05/27/20   [provider]  COCONUT OIL PO Take 1 capsule by mouth daily.    [provider]  escitalopram (LEXAPRO) 10 MG tablet Take 15 mg by mouth daily. 05/08/21   [provider]  FIBER PO Take by mouth 3 (three) times daily.    [provider]  fluticasone (FLONASE) 50 MCG/ACT nasal spray Place 1  spray into both nostrils daily. 12/21/17   Kinnie Feil, PA-C  lidocaine (XYLOCAINE) 5 % ointment Apply 1 application topically 3 (three) times daily. 05/01/19   Nunzio Cobbs, MD  loratadine (CLARITIN) 10 MG tablet Take 10 mg by mouth daily.    [provider]  Multiple Vitamins-Minerals (ONE-A-DAY MENOPAUSE FORMULA PO) Take 1 tablet by mouth daily.    [provider]  Omega-3 Fatty Acids (FISH OIL) 1000 MG CAPS Take 1 capsule by mouth daily.    [provider]  pantoprazole (PROTONIX) 40 MG tablet Take 1 tablet by mouth daily. 05/27/20   [provider]  Probiotic Product (PROBIOTIC PO) Take by mouth daily.    [provider]  Simethicone (GAS-X PO) Take 1 tablet by mouth as needed.    [provider]  triamcinolone (KENALOG) 0.025 % ointment Apply 1 Application topically 2 (two) times daily. 09/11/21   Nunzio Cobbs, MD      Allergies    Nickel, Peanut oil, Protonix [pantoprazole sodium], Doxycycline, Latex, and Penicillins    Review of Systems   Review of Systems  Musculoskeletal:  Positive for back pain.  Skin:  Positive for rash.    Physical Exam Updated Vital Signs BP 121/86   Pulse 95   Temp 98.2 F (36.8 C) (Oral)   Resp 16   SpO2 95%  Physical Exam Vitals and nursing note reviewed.  Constitutional:  General: She is not in acute distress.    Appearance: She is well-developed.  HENT:     Head: Normocephalic and atraumatic.  Eyes:     Conjunctiva/sclera: Conjunctivae normal.  Cardiovascular:     Rate and Rhythm: Normal rate and regular rhythm.  Pulmonary:     Effort: Pulmonary effort is normal. No respiratory distress.     Breath sounds: Normal breath sounds.  Abdominal:     Palpations: Abdomen is soft.     Tenderness: There is no abdominal tenderness.  Musculoskeletal:        General: No swelling.     Cervical back: Neck supple.  Skin:    General: Skin is warm and dry.      Capillary Refill: Capillary refill takes less than 2 seconds.     Comments: Pustular eruptions along the palms and soles of the feet  Neurological:     Mental Status: She is alert.  Psychiatric:        Mood and Affect: Mood normal.          ED Results / Procedures / Treatments   Labs (all labs ordered are listed, but only abnormal results are displayed) Labs Reviewed  CBC WITH DIFFERENTIAL/PLATELET - Abnormal; Notable for the following components:      Result Value   RBC 5.12 (*)    All other components within normal limits  BASIC METABOLIC PANEL - Abnormal; Notable for the following components:   Glucose, Bld 116 (*)    All other components within normal limits  HEPATIC FUNCTION PANEL  HIV ANTIBODY (ROUTINE TESTING W REFLEX)  RPR    EKG None  Radiology No results found.  Procedures Procedures    Medications Ordered in ED Medications  Glycerin (Adult) 2.1 g suppository 1 suppository (1 suppository Rectal Given 09/17/21 0848)    ED Course/ Medical Decision Making/ A&P                           Medical Decision Making Amount and/or Complexity of Data Reviewed Labs: ordered.  Risk OTC drugs. Prescription drug management.   51 year old female with a medical history significant for biopsy proven psoriasis, HSV-2, HLD, anxiety, GERD who presents to the emergency department with a generalized pustular rash on her palms and soles in addition to concern for constipation.  The patient states that she has had some abdominal cramping.  She took milk of magnesia and had a loose stool yesterday.  Her last bowel movement was yesterday.  She states that she is passing gas.  She denies any hematochezia or melena.  She has had a pustular rash along her palms and soles for the last 2 weeks.  She has not been sexually active for the last 2 years.  The rash is erythematous with some excoriations and a pustular eruption.  On arrival, the patient was vitally stable.  NSR noted on  cardiac telemetry on my evaluation.  Physical exam concerning for a pustular eruption along the palms and soles of the feet.  Patient's abdomen was soft, nontender, nondistended to palpation.  Low concern for small bowel obstruction.  Differential diagnosis includes palmar plantar pustulosis, acute localized pustular psoriasis, dermatitis with secondary infection, dyshidrotic eczema, less likely tinea, less likely scabies, less likely viral exanthem, hand-foot-and-mouth, less likely syphilis.  Screening laboratory evaluation conducted significant for normal CBC and BMP and hepatic function panel.  Patient was screened for HIV and syphilis although low suspicion for this.  Patient will be prescribed a short course of antibiotics and steroids for her Pustular eruption on her hands which appears to be palmar plantar pustulosis or an acute localized pustular psoriasis.  After glycerin suppository was administered, the patient had a satisfactory bowel movement in the emergency department.  On repeat assessment, the patient was resting, asleep, in no distress, no complaint of pain.  Overall stable for discharge with outpatient dermatology follow-up.  Final Clinical Impression(s) / ED Diagnoses Final diagnoses:  Localized pustular psoriasis  Constipation, unspecified constipation type    Rx / DC Orders ED Discharge Orders          Ordered    sulfamethoxazole-trimethoprim (BACTRIM DS) 800-160 MG tablet  2 times daily        09/17/21 1035    predniSONE (DELTASONE) 10 MG tablet  Daily        09/17/21 1035    Ambulatory referral to Dermatology        09/17/21 1037    glycerin adult 2 g suppository  As needed        09/17/21 1037              Regan Lemming, MD 09/17/21 1040

## 2021-09-17 NOTE — ED Notes (Signed)
Pt had one soft BM

## 2021-09-17 NOTE — Discharge Instructions (Addendum)
Please follow-up outpatient with dermatology.  Will prescribe a short course of oral steroids and antibiotics.

## 2021-09-18 ENCOUNTER — Other Ambulatory Visit: Payer: Self-pay

## 2021-09-18 LAB — RPR: RPR Ser Ql: NONREACTIVE

## 2021-09-18 MED ORDER — AZITHROMYCIN 250 MG PO TABS: ORAL_TABLET | ORAL | 0 refills | Status: DC

## 2021-09-29 NOTE — Progress Notes (Unsigned)
GYNECOLOGY  VISIT   HPI: 51 y.o.   Single  African American  female   [phone] with No LMP recorded. Patient is perimenopausal.   here for follow up. Could not finish Zithromax due to blurry vision.  She took a few pills, but did not finish the last 3 pills.   Patient had postmenopausal bleeding and endometrial biopsy showing eosinophilic metaplasia and focal fragment of fibropurulent/necrotic debris of uncertain clinical significance with acute inflammation.   Negative GC/CT/trichomonas/HIV/RPR/hep C testing on 07/31/21.   Patient does note some right lower quadrant pain.   No further bleeding.   Patient was seen in the ER recently and received a course of Bactrim DS for treating pustular rash of her hands and feet.  Patient states she has inflammation in her body.  She is taking steroids and when she stops, the rash reappears on her hands and feet.   Allergies show rash with PCN.  States she is able to take Amoxicillin without any difficulty. Doxycycline causes a rash.  GYNECOLOGIC HISTORY: No LMP recorded. Patient is perimenopausal. Contraception:  Tubal Menopausal hormone therapy:  none Last mammogram:   05-16-21 Neg/Birads1 Last pap smear:   07-31-21 Neg:Neg HR HPV, 04-28-18 ASCUS:Neg HR HPV, 2017 normal per patient                OB History     Gravida  3   Para  2   Term  2   Preterm      AB  1   Living  2      SAB      IAB      Ectopic      Multiple      Live Births                 Patient Active Problem List   Diagnosis Date Noted   Pelvic pain 11/06/2017   Anxiety 08/05/2017   Stress incontinence 05/20/2017   Fibroids 04/02/2017    Past Medical History:  Diagnosis Date   Anxiety    Dysmenorrhea    Fibroid    Gastritis 2019   GERD (gastroesophageal reflux disease)    HSV-2 infection 2021   Hyperlipidemia    Psoriasis    Urinary incontinence     Past Surgical History:  Procedure Laterality Date   COLONOSCOPY WITH  ESOPHAGOGASTRODUODENOSCOPY (EGD)  09/24/2017   TUBAL LIGATION      Current Outpatient Medications  Medication Sig Dispense Refill   albuterol (VENTOLIN HFA) 108 (90 Base) MCG/ACT inhaler Inhale 2 puffs into the lungs 4 (four) times daily.     COCONUT OIL PO Take 1 capsule by mouth daily.     escitalopram (LEXAPRO) 10 MG tablet Take 15 mg by mouth daily.     FIBER PO Take by mouth 3 (three) times daily.     fluticasone (FLONASE) 50 MCG/ACT nasal spray Place 1 spray into both nostrils daily. 16 g 0   glycerin adult 2 g suppository Unwrap and place 1 suppository rectally as needed for constipation. 12 suppository 0   lidocaine (XYLOCAINE) 5 % ointment Apply 1 application topically 3 (three) times daily. 1.25 g 0   loratadine (CLARITIN) 10 MG tablet Take 10 mg by mouth daily.     Multiple Vitamins-Minerals (ONE-A-DAY MENOPAUSE FORMULA PO) Take 1 tablet by mouth daily.     Omega-3 Fatty Acids (FISH OIL) 1000 MG CAPS Take 1 capsule by mouth daily.     pantoprazole (PROTONIX) 40 MG tablet  Take 1 tablet by mouth daily.     Probiotic Product (PROBIOTIC PO) Take by mouth daily.     Simethicone (GAS-X PO) Take 1 tablet by mouth as needed.     sulfamethoxazole-trimethoprim (BACTRIM DS) 800-160 MG tablet Take 1 tablet by mouth 2 (two) times daily.     triamcinolone (KENALOG) 0.025 % ointment Apply 1 Application topically 2 (two) times daily. 30 g 0   azithromycin (ZITHROMAX) 250 MG tablet Take two tabs po Day 1 and then one tab po daily Day 2-5. (Patient not taking: Reported on 09/30/2021) 6 tablet 0   No current facility-administered medications for this visit.     ALLERGIES: Nickel, Peanut oil, Protonix [pantoprazole sodium], Doxycycline, Latex, and Penicillins  Family History  Problem Relation Age of Onset   Thyroid disease Mother    Lupus Mother    Stroke Father        TIA   Breast cancer Maternal Aunt    Hypertension Maternal Grandmother     Social History   Socioeconomic History    Marital status: Single    Spouse name: Not on file   Number of children: Not on file   Years of education: Not on file   Highest education level: Not on file  Occupational History   Not on file  Tobacco Use   Smoking status: Every Day    Packs/day: 1.00    Types: Cigarettes   Smokeless tobacco: Never  Vaping Use   Vaping Use: Never used  Substance and Sexual Activity   Alcohol use: Yes    Alcohol/week: 2.0 standard drinks of alcohol    Types: 2 Glasses of wine per week    Comment: social   Drug use: No   Sexual activity: Not Currently    Birth control/protection: Surgical    Comment: tubal  Other Topics Concern   Not on file  Social History Narrative   Not on file   Social Determinants of Health   Financial Resource Strain: Not on file  Food Insecurity: Not on file  Transportation Needs: Not on file  Physical Activity: Not on file  Stress: Not on file  Social Connections: Not on file  Intimate Partner Violence: Not on file    Review of Systems  All other systems reviewed and are negative.   PHYSICAL EXAMINATION:    BP 130/72   Ht '5\' 1"'$  (1.549 m)   Wt 174 lb (78.9 kg)   BMI 32.88 kg/m     General appearance: alert, cooperative and appears stated age   Pelvic: External genitalia:  no lesions              Urethra:  normal appearing urethra with no masses, tenderness or lesions              Bartholins and Skenes: normal                 Vagina: normal appearing vagina with normal color and discharge, no lesions              Cervix: no lesions.  No CMT.  No bleeding noted.                 Bimanual Exam:  Uterus:  fibroids noted, tender on the right.              Adnexa: no mass, fullness, tenderness   Chaperone was present for exam:  Estill Bamberg, CMA  ASSESSMENT  Postmenopausal bleeding.  Resolved.  RLQ pain.  Endometritis.  I suspect this is caused from a uterine fibroid and possible degeneration of the fibroid. Intolerance to Azithromycin.   PLAN  Will  treat with Ceftriaxone 500 mg IM now and then Flagyl 500 mg po bid x 14 days.  Fu in the office in 14 days.    An After Visit Summary was printed and given to the patient.  30 min  total time was spent for this patient encounter, including preparation, face-to-face counseling with the patient, coordination of care, and documentation of the encounter.

## 2021-09-30 ENCOUNTER — Ambulatory Visit (INDEPENDENT_AMBULATORY_CARE_PROVIDER_SITE_OTHER): Payer: BC Managed Care – PPO | Admitting: Obstetrics and Gynecology

## 2021-09-30 ENCOUNTER — Encounter: Payer: Self-pay | Admitting: Obstetrics and Gynecology

## 2021-09-30 VITALS — BP 130/72 | Ht 61.0 in | Wt 174.0 lb

## 2021-09-30 DIAGNOSIS — N719 Inflammatory disease of uterus, unspecified: Secondary | ICD-10-CM

## 2021-09-30 MED ORDER — CEFTRIAXONE SODIUM 500 MG IJ SOLR
500.0000 mg | Freq: Once | INTRAMUSCULAR | Status: AC
  Administered 2021-09-30: 500 mg via INTRAMUSCULAR

## 2021-09-30 MED ORDER — METRONIDAZOLE 500 MG PO TABS: 500.0000 mg | ORAL_TABLET | Freq: Two times a day (BID) | ORAL | 0 refills | Status: DC

## 2021-10-07 NOTE — Progress Notes (Unsigned)
GYNECOLOGY  VISIT   HPI: 51 y.o.   Single  African American/Indian  female   [phone] with No LMP recorded. Patient is perimenopausal.   here for 2 week follow up.   Taking Flagyl and took Ceftriaxone for endometritis. She did not tolerate azithromycin due to blurry vision.   She is being treated based on her endometrial biopsy 09/11/21 done for postmenopausal bleeding. It showed atrophic endometrium and fibropurulent necrotic debris of uncertain clinical significance and acute inflammation.   She has known fibroids on ultrasound.   Neg GC/CT/trich on 07/31/21.  Still has right sided pain, but it is improved.  Taking Tylenol and this is helping.   Having hot flashes all day and every day, and she wants help with this.  Sweating at night Last bleeding was in April, 2023.  Her bleeding prior to that was one year prior. FSH 37.8 and and estradiol < 15 on 07/31/21.    Lexapro maks her constipated.  She takes 1/2 pill daily.  Sh is looking for alternatives and has difficulty seeing her PCP for this prescription and care due to the distance she needs to travel.  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   GYNECOLOGIC HISTORY: No LMP recorded. Patient is perimenopausal. Contraception:  Tubal Menopausal hormone therapy:  none Last mammogram:  05-16-21 Neg/Birads1 Last pap smear:   07-31-21 Neg:Neg HR HPV, 04-28-18 ASCUS:Neg HR HPV, 2017 normal per patient         OB History     Gravida  3   Para  2   Term  2   Preterm      AB  1   Living  2      SAB      IAB      Ectopic      Multiple      Live Births                 Patient Active Problem List   Diagnosis Date Noted   Pelvic pain 11/06/2017   Anxiety 08/05/2017   Stress incontinence 05/20/2017   Fibroids 04/02/2017    Past Medical History:  Diagnosis Date   Anxiety    Dysmenorrhea    Fibroid    Gastritis 2019   GERD (gastroesophageal reflux disease)    HSV-2 infection 2021   Hyperlipidemia     Psoriasis    Urinary incontinence     Past Surgical History:  Procedure Laterality Date   COLONOSCOPY WITH ESOPHAGOGASTRODUODENOSCOPY (EGD)  09/24/2017   TUBAL LIGATION      Current Outpatient Medications  Medication Sig Dispense Refill   venlafaxine XR (EFFEXOR XR) 37.5 MG 24 hr capsule Take 1 capsule (37.5 mg total) by mouth daily. 30 capsule 1   albuterol (VENTOLIN HFA) 108 (90 Base) MCG/ACT inhaler Inhale 2 puffs into the lungs 4 (four) times daily.     COCONUT OIL PO Take 1 capsule by mouth daily.     FIBER PO Take by mouth 3 (three) times daily.     fluticasone (FLONASE) 50 MCG/ACT nasal spray Place 1 spray into both nostrils daily. 16 g 0   glycerin adult 2 g suppository Unwrap and place 1 suppository rectally as needed for constipation. 12 suppository 0   lidocaine (XYLOCAINE) 5 % ointment Apply 1 application topically 3 (three) times daily. 1.25 g 0   loratadine (CLARITIN) 10 MG tablet Take 10 mg by mouth daily.     metroNIDAZOLE (FLAGYL) 500 MG tablet Take 1  tablet (500 mg total) by mouth 2 (two) times daily. 28 tablet 0   Multiple Vitamins-Minerals (ONE-A-DAY MENOPAUSE FORMULA PO) Take 1 tablet by mouth daily.     Omega-3 Fatty Acids (FISH OIL) 1000 MG CAPS Take 1 capsule by mouth daily.     pantoprazole (PROTONIX) 40 MG tablet Take 1 tablet by mouth daily.     Probiotic Product (PROBIOTIC PO) Take by mouth daily.     Simethicone (GAS-X PO) Take 1 tablet by mouth as needed.     sulfamethoxazole-trimethoprim (BACTRIM DS) 800-160 MG tablet Take 1 tablet by mouth 2 (two) times daily.     triamcinolone (KENALOG) 0.025 % ointment Apply 1 Application topically 2 (two) times daily. 30 g 0   No current facility-administered medications for this visit.     ALLERGIES: Azithromycin, Nickel, Peanut oil, Protonix [pantoprazole sodium], Doxycycline, Latex, and Penicillins  Family History  Problem Relation Age of Onset   Thyroid disease Mother    Lupus Mother    Stroke Father         TIA   Breast cancer Maternal Aunt    Hypertension Maternal Grandmother     Social History   Socioeconomic History   Marital status: Single    Spouse name: Not on file   Number of children: Not on file   Years of education: Not on file   Highest education level: Not on file  Occupational History   Not on file  Tobacco Use   Smoking status: Every Day    Packs/day: 1.00    Types: Cigarettes   Smokeless tobacco: Never  Vaping Use   Vaping Use: Never used  Substance and Sexual Activity   Alcohol use: Yes    Alcohol/week: 2.0 standard drinks of alcohol    Types: 2 Glasses of wine per week    Comment: social   Drug use: No   Sexual activity: Not Currently    Birth control/protection: Surgical    Comment: tubal  Other Topics Concern   Not on file  Social History Narrative   Not on file   Social Determinants of Health   Financial Resource Strain: Not on file  Food Insecurity: Not on file  Transportation Needs: Not on file  Physical Activity: Not on file  Stress: Not on file  Social Connections: Not on file  Intimate Partner Violence: Not on file    Review of Systems  All other systems reviewed and are negative.   PHYSICAL EXAMINATION:    BP 122/70 (BP Location: Right Arm, Patient Position: Sitting)   Pulse (!) 102   Resp 20   SpO2 93%     General appearance: alert, cooperative and appears stated age   Pelvic: External genitalia:  no lesions              Urethra:  normal appearing urethra with no masses, tenderness or lesions              Bartholins and Skenes: normal                 Vagina: normal appearing vagina with normal color and discharge, no lesions              Cervix: no lesions                Bimanual Exam:  Uterus:  9 - 10 week size with right sided fibroid that is tender.  Adnexa: no mass, fullness, tenderness           Chaperone was present for exam:  Kimalexis.   ASSESSMENT  Endometritis. I suspect fibroid as etiology.   She is improved. Fibroid uterus.  Menopausal symptoms.   PLAN  Finish Flagyl. We discussed options for treating menopausal symptoms:  HRT, Gabapentin, Paxil, and Effexor.  We reviewed risks and benefits of each.  She will stop the Lexapro. She will start Effexor XR 37.5 mg daily. Fu in 6 weeks, sooner if needed.    An After Visit Summary was printed and given to the patient.  26 min  total time was spent for this patient encounter, including preparation, face-to-face counseling with the patient, coordination of care, and documentation of the encounter.

## 2021-10-13 ENCOUNTER — Encounter: Payer: Self-pay | Admitting: Obstetrics and Gynecology

## 2021-10-13 ENCOUNTER — Ambulatory Visit (INDEPENDENT_AMBULATORY_CARE_PROVIDER_SITE_OTHER): Payer: BC Managed Care – PPO | Admitting: Obstetrics and Gynecology

## 2021-10-13 VITALS — BP 122/70 | HR 102 | Resp 20

## 2021-10-13 DIAGNOSIS — N719 Inflammatory disease of uterus, unspecified: Secondary | ICD-10-CM | POA: Diagnosis not present

## 2021-10-13 DIAGNOSIS — N951 Menopausal and female climacteric states: Secondary | ICD-10-CM | POA: Diagnosis not present

## 2021-10-13 MED ORDER — VENLAFAXINE HCL ER 37.5 MG PO CP24: 37.5000 mg | ORAL_CAPSULE | Freq: Every day | ORAL | 1 refills | Status: AC

## 2021-10-13 NOTE — Patient Instructions (Signed)
Venlafaxine Extended-Release Capsules What is this medication? VENLAFAXINE (VEN la fax een) treats depression and anxiety. It increases the amount of serotonin and norepinephrine in the brain, hormones that help regulate mood. It belongs to a group of medications called SNRIs. This medicine may be used for other purposes; ask your health care provider or pharmacist if you have questions. COMMON BRAND NAME(S): Effexor XR What should I tell my care team before I take this medication? They need to know if you have any of these conditions: Bleeding disorders Glaucoma Heart disease High blood pressure High cholesterol Kidney disease Liver disease Low levels of sodium in the blood Mania or bipolar disorder Seizures Suicidal thoughts, plans, or attempt; a previous suicide attempt by you or a family Take medications that treat or prevent blood clots Thyroid disease An unusual or allergic reaction to venlafaxine, desvenlafaxine, other medications, foods, dyes, or preservatives Pregnant or trying to get pregnant Breast-feeding How should I use this medication? Take this medication by mouth with a full glass of water. Follow the directions on the prescription label. Do not cut, crush, or chew this medication. Take it with food. If needed, the capsule may be carefully opened and the entire contents sprinkled on a spoonful of cool applesauce. Swallow the applesauce/pellet mixture right away without chewing and follow with a glass of water to ensure complete swallowing of the pellets. Try to take your medication at about the same time each day. Do not take your medication more often than directed. Do not stop taking this medication suddenly except upon the advice of your care team. Stopping this medication too quickly may cause serious side effects or your condition may worsen. A special MedGuide will be given to you by the pharmacist with each prescription and refill. Be sure to read this information  carefully each time. Talk to your care team regarding the use of this medication in children. Special care may be needed. Overdosage: If you think you have taken too much of this medicine contact a poison control center or emergency room at once. NOTE: This medicine is only for you. Do not share this medicine with others. What if I miss a dose? If you miss a dose, take it as soon as you can. If it is almost time for your next dose, take only that dose. Do not take double or extra doses. What may interact with this medication? Do not take this medication with any of the following: Certain medications for fungal infections like fluconazole, itraconazole, ketoconazole, posaconazole, voriconazole Cisapride Desvenlafaxine Dronedarone Duloxetine Levomilnacipran Linezolid MAOIs like Carbex, Eldepryl, Marplan, Nardil, and Parnate Methylene blue (injected into a vein) Milnacipran Pimozide Thioridazine This medication may also interact with the following: Amphetamines Aspirin and aspirin-like medications Certain medications for depression, anxiety, or psychotic disturbances Certain medications for migraine headaches like almotriptan, eletriptan, frovatriptan, naratriptan, rizatriptan, sumatriptan, zolmitriptan Certain medications for sleep Certain medications that treat or prevent blood clots like dalteparin, enoxaparin, warfarin Cimetidine Clozapine Diuretics Fentanyl Furazolidone Indinavir Isoniazid Lithium Metoprolol NSAIDS, medications for pain and inflammation, like ibuprofen or naproxen Other medications that prolong the QT interval (cause an abnormal heart rhythm) like dofetilide, ziprasidone Procarbazine Rasagiline Supplements like St. John's wort, kava kava, valerian Tramadol Tryptophan This list may not describe all possible interactions. Give your health care provider a list of all the medicines, herbs, non-prescription drugs, or dietary supplements you use. Also tell them  if you smoke, drink alcohol, or use illegal drugs. Some items may interact with your medicine. What should   I watch for while using this medication? Tell your care team if your symptoms do not get better or if they get worse. Visit your care team for regular checks on your progress. Because it may take several weeks to see the full effects of this medication, it is important to continue your treatment as prescribed by your care team. Watch for new or worsening thoughts of suicide or depression. This includes sudden changes in mood, behaviors, or thoughts. These changes can happen at any time but are more common in the beginning of treatment or after a change in dose. Call your care team right away if you experience these thoughts or worsening depression. Manic episodes may happen in patients with bipolar disorder who take this medication. Watch for changes in feelings or behaviors such as feeling anxious, nervous, agitated, panicky, irritable, hostile, aggressive, impulsive, severely restless, overly excited and hyperactive, or trouble sleeping. These changes can happen at any time but are more common in the beginning of treatment or after a change in dose. Call your care team right away if you notice any of these symptoms. This medication can cause an increase in blood pressure. Check with your care team for instructions on monitoring your blood pressure while taking this medication. You may get drowsy or dizzy. Do not drive, use machinery, or do anything that needs mental alertness until you know how this medication affects you. Do not stand or sit up quickly, especially if you are an older patient. This reduces the risk of dizzy or fainting spells. Do not drink alcohol while taking this medication. Drinking alcohol may alter the effects of your medication. Serious side effects may occur. Your mouth may get dry. Chewing sugarless gum, sucking hard candy and drinking plenty of water will help. Contact your  care team if the problem does not go away or is severe. What side effects may I notice from receiving this medication? Side effects that you should report to your care team as soon as possible: Allergic reactions--skin rash, itching, hives, swelling of the face, lips, tongue, or throat Bleeding--bloody or black, tar-like stools, red or dark brown urine, vomiting blood or brown material that looks like coffee grounds, small, red or purple spots on skin, unusual bleeding or bruising Heart rhythm changes--fast or irregular heartbeat, dizziness, feeling faint or lightheaded, chest pain, trouble breathing Increase in blood pressure Loss of appetite with weight loss Low sodium level--muscle weakness, fatigue, dizziness, headache, confusion Serotonin syndrome--irritability, confusion, fast or irregular heartbeat, muscle stiffness, twitching muscles, sweating, high fever, seizures, chills, vomiting, diarrhea Sudden eye pain or change in vision such as blurry vision, seeing halos around lights, vision loss Thoughts of suicide or self-harm, worsening mood, feelings of depression Side effects that usually do not require medical attention (report to your care team if they continue or are bothersome): Anxiety, nervousness Change in sex drive or performance Dizziness Dry mouth Excessive sweating Nausea Tremors or shaking Trouble sleeping This list may not describe all possible side effects. Call your doctor for medical advice about side effects. You may report side effects to FDA at 1-800-FDA-1088. Where should I keep my medication? Keep out of the reach of children and pets. Store at a controlled temperature between 20 and 25 degrees C (68 degrees and 77 degrees F), in a dry place. Throw away any unused medication after the expiration date. NOTE: This sheet is a summary. It may not cover all possible information. If you have questions about this medicine, talk to your doctor,   pharmacist, or health care  provider.  2023 Elsevier/Gold Standard (2020-01-31 00:00:00)  

## 2022-01-21 DIAGNOSIS — L403 Pustulosis palmaris et plantaris: Secondary | ICD-10-CM | POA: Diagnosis not present

## 2022-02-05 DIAGNOSIS — L403 Pustulosis palmaris et plantaris: Secondary | ICD-10-CM | POA: Diagnosis not present

## 2022-04-17 ENCOUNTER — Other Ambulatory Visit: Payer: Self-pay | Admitting: Obstetrics and Gynecology

## 2022-04-17 DIAGNOSIS — Z1231 Encounter for screening mammogram for malignant neoplasm of breast: Secondary | ICD-10-CM

## 2022-05-21 DIAGNOSIS — L403 Pustulosis palmaris et plantaris: Secondary | ICD-10-CM | POA: Diagnosis not present

## 2022-06-04 ENCOUNTER — Ambulatory Visit
Admission: RE | Admit: 2022-06-04 | Discharge: 2022-06-04 | Disposition: A | Discharge status: Home / Self Care | Payer: BC Managed Care – PPO | Source: Ambulatory Visit | Attending: Obstetrics and Gynecology | Admitting: Obstetrics and Gynecology

## 2022-06-04 DIAGNOSIS — Z1231 Encounter for screening mammogram for malignant neoplasm of breast: Secondary | ICD-10-CM

## 2022-08-18 NOTE — Progress Notes (Deleted)
52 y.o. A6T0160 Single African American female here for annual exam.    PCP:     No LMP recorded. Patient is perimenopausal.           Sexually active: {yes no:314532}  The current method of family planning is perimenopause.    Exercising: {yes no:314532}  {types:19826} Smoker:  yes  Health Maintenance: Pap:  07/31/21 neg: HR HPV neg, 04/28/18 ASCUS History of abnormal Pap:  no MMG:  06/04/22 Breast Density Cat B, BI-RADS CAT 1 neg Colonoscopy:  09/24/17 BMD:   n/a  Result  n/a TDaP:  03/20/15 Gardasil:   no HIV: 07/24/20 NR Hep C: 07/24/20 neg Screening Labs:  Hb today: ***, Urine today: ***   reports that she has been smoking cigarettes. She has been smoking an average of 1 pack per day. She has never used smokeless tobacco. She reports current alcohol use of about 2.0 standard drinks of alcohol per week. She reports that she does not use drugs.  Past Medical History:  Diagnosis Date   Anxiety    Dysmenorrhea    Fibroid    Gastritis 2019   GERD (gastroesophageal reflux disease)    HSV-2 infection 2021   Hyperlipidemia    Psoriasis    Urinary incontinence     Past Surgical History:  Procedure Laterality Date   COLONOSCOPY WITH ESOPHAGOGASTRODUODENOSCOPY (EGD)  09/24/2017   TUBAL LIGATION      Current Outpatient Medications  Medication Sig Dispense Refill   albuterol (VENTOLIN HFA) 108 (90 Base) MCG/ACT inhaler Inhale 2 puffs into the lungs 4 (four) times daily.     COCONUT OIL PO Take 1 capsule by mouth daily.     FIBER PO Take by mouth 3 (three) times daily.     fluticasone (FLONASE) 50 MCG/ACT nasal spray Place 1 spray into both nostrils daily. 16 g 0   glycerin adult 2 g suppository Unwrap and place 1 suppository rectally as needed for constipation. 12 suppository 0   lidocaine (XYLOCAINE) 5 % ointment Apply 1 application topically 3 (three) times daily. 1.25 g 0   loratadine (CLARITIN) 10 MG tablet Take 10 mg by mouth daily.     metroNIDAZOLE (FLAGYL) 500 MG tablet Take  1 tablet (500 mg total) by mouth 2 (two) times daily. 28 tablet 0   Multiple Vitamins-Minerals (ONE-A-DAY MENOPAUSE FORMULA PO) Take 1 tablet by mouth daily.     Omega-3 Fatty Acids (FISH OIL) 1000 MG CAPS Take 1 capsule by mouth daily.     pantoprazole (PROTONIX) 40 MG tablet Take 1 tablet by mouth daily.     Probiotic Product (PROBIOTIC PO) Take by mouth daily.     Simethicone (GAS-X PO) Take 1 tablet by mouth as needed.     sulfamethoxazole-trimethoprim (BACTRIM DS) 800-160 MG tablet Take 1 tablet by mouth 2 (two) times daily.     triamcinolone (KENALOG) 0.025 % ointment Apply 1 Application topically 2 (two) times daily. 30 g 0   venlafaxine XR (EFFEXOR XR) 37.5 MG 24 hr capsule Take 1 capsule (37.5 mg total) by mouth daily. 30 capsule 1   No current facility-administered medications for this visit.    Family History  Problem Relation Age of Onset   Thyroid disease Mother    Lupus Mother    Stroke Father        TIA   Breast cancer Maternal Aunt    Hypertension Maternal Grandmother     Review of Systems  Exam:   There were no vitals  taken for this visit.    General appearance: alert, cooperative and appears stated age Head: normocephalic, without obvious abnormality, atraumatic Neck: no adenopathy, supple, symmetrical, trachea midline and thyroid normal to inspection and palpation Lungs: clear to auscultation bilaterally Breasts: normal appearance, no masses or tenderness, No nipple retraction or dimpling, No nipple discharge or bleeding, No axillary adenopathy Heart: regular rate and rhythm Abdomen: soft, non-tender; no masses, no organomegaly Extremities: extremities normal, atraumatic, no cyanosis or edema Skin: skin color, texture, turgor normal. No rashes or lesions Lymph nodes: cervical, supraclavicular, and axillary nodes normal. Neurologic: grossly normal  Pelvic: External genitalia:  no lesions              No abnormal inguinal nodes palpated.              Urethra:   normal appearing urethra with no masses, tenderness or lesions              Bartholins and Skenes: normal                 Vagina: normal appearing vagina with normal color and discharge, no lesions              Cervix: no lesions              Pap taken: {yes no:314532} Bimanual Exam:  Uterus:  normal size, contour, position, consistency, mobility, non-tender              Adnexa: no mass, fullness, tenderness              Rectal exam: {yes no:314532}.  Confirms.              Anus:  normal sphincter tone, no lesions  Chaperone was present for exam:  ***  Assessment:   Well woman visit with gynecologic exam.   Plan: Mammogram screening discussed. Self breast awareness reviewed. Pap and HR HPV as above. Guidelines for Calcium, Vitamin D, regular exercise program including cardiovascular and weight bearing exercise.   Follow up annually and prn.   Additional counseling given.  {yes [id number]. _______ minutes face to face time of which over 50% was spent in counseling.    After visit summary provided.

## 2022-09-01 ENCOUNTER — Ambulatory Visit: Payer: BC Managed Care – PPO | Admitting: Obstetrics and Gynecology

## 2022-09-29 NOTE — Progress Notes (Unsigned)
GYNECOLOGY  VISIT   HPI: 52 y.o.   Single  Caucasian  female   [id number] with postmenopausal here for infection. Having vaginal discharge and odor weeks ago. Some pelvic pressure.  No over the counter treatment.  Had sex with her ex.   No blood in urine.   She wants STD screening.   Chronic constipation and hemorrhoids. Has stool softeners, Miralax.  Eats a lot of bread.  Multiple allergies.   See list. Takes amoxicillin but has rash with PCN.   Working from home again.   GYNECOLOGIC HISTORY: Contraception:  postmenopausal. Menopausal hormone therapy:  n/a Last mammogram:  06/04/22 Breast Density Cat B, BI-RADS CAT 1 neg Last pap smear:   07/31/21 neg: HR HPV neg, 04/28/18 ASCUS: HR HPV neg        OB History     Gravida  3   Para  2   Term  2   Preterm      AB  1   Living  2      SAB      IAB      Ectopic      Multiple      Live Births                 Patient Active Problem List   Diagnosis Date Noted   Pelvic pain 11/06/2017   Anxiety 08/05/2017   Stress incontinence 05/20/2017   Fibroids 04/02/2017    Past Medical History:  Diagnosis Date   Anxiety    Dysmenorrhea    Fibroid    Gastritis 2019   GERD (gastroesophageal reflux disease)    HSV-2 infection 2021   Hyperlipidemia    Psoriasis    Urinary incontinence     Past Surgical History:  Procedure Laterality Date   COLONOSCOPY WITH ESOPHAGOGASTRODUODENOSCOPY (EGD)  09/24/2017   TUBAL LIGATION      Current Outpatient Medications  Medication Sig Dispense Refill   albuterol (VENTOLIN HFA) 108 (90 Base) MCG/ACT inhaler Inhale 2 puffs into the lungs 4 (four) times daily.     COCONUT OIL PO Take 1 capsule by mouth daily.     FIBER PO Take by mouth 3 (three) times daily.     fluticasone (FLONASE) 50 MCG/ACT nasal spray Place 1 spray into both nostrils daily. 16 g 0   loratadine (CLARITIN) 10 MG tablet Take 10 mg by mouth daily.     Multiple Vitamins-Minerals (ONE-A-DAY MENOPAUSE  FORMULA PO) Take 1 tablet by mouth daily.     Omega-3 Fatty Acids (FISH OIL) 1000 MG CAPS Take 1 capsule by mouth daily.     pantoprazole (PROTONIX) 40 MG tablet Take 1 tablet by mouth daily.     Probiotic Product (PROBIOTIC PO) Take by mouth daily.     Roflumilast (ZORYVE) 0.3 % CREA Apply topically.     Simethicone (GAS-X PO) Take 1 tablet by mouth as needed.     venlafaxine XR (EFFEXOR XR) 37.5 MG 24 hr capsule Take 1 capsule (37.5 mg total) by mouth daily. 30 capsule 1   No current facility-administered medications for this visit.     ALLERGIES: Azithromycin, Nickel, Peanut oil, Protonix [pantoprazole sodium], Doxycycline, Latex, and Penicillins  Family History  Problem Relation Age of Onset   Thyroid disease Mother    Lupus Mother    Stroke Father        TIA   Breast cancer Maternal Aunt    Hypertension Maternal Grandmother     Social History  Socioeconomic History   Marital status: Single    Spouse name: Not on file   Number of children: Not on file   Years of education: Not on file   Highest education level: Not on file  Occupational History   Not on file  Tobacco Use   Smoking status: Every Day    Current packs/day: 1.00    Types: Cigarettes   Smokeless tobacco: Never  Vaping Use   Vaping status: Never Used  Substance and Sexual Activity   Alcohol use: Not Currently   Drug use: No   Sexual activity: Yes    Birth control/protection: Surgical    Comment: tubal  Other Topics Concern   Not on file  Social History Narrative   Not on file   Social Determinants of Health   Financial Resource Strain: Not on file  Food Insecurity: Not on file  Transportation Needs: Not on file  Physical Activity: Not on file  Stress: Not on file  Social Connections: Not on file  Intimate Partner Violence: Not on file    Review of Systems  Constitutional: Negative.   HENT: Negative.    Eyes: Negative.   Respiratory: Negative.    Cardiovascular: Negative.    Gastrointestinal:  Positive for constipation.  Endocrine: Negative.   Genitourinary:        Pelvic pain, bloating, vaginal discharge  Musculoskeletal: Negative.   Skin: Negative.   Allergic/Immunologic: Negative.   Neurological: Negative.   Hematological: Negative.   Psychiatric/Behavioral: Negative.      PHYSICAL EXAMINATION:    BP 108/60   Pulse 90   Temp 98.3 F (36.8 C) (Oral)   Wt 174 lb (78.9 kg)   SpO2 96%   BMI 32.88 kg/m     General appearance: alert, cooperative and appears stated age   Pelvic: External genitalia:  no lesions              Urethra:  normal appearing urethra with no masses, tenderness or lesions              Bartholins and Skenes: normal                 Vagina: normal appearing vagina with normal color and discharge, no lesions              Cervix: no lesions.  Cervical motion tenderness present.                  Bimanual Exam:  Uterus:  normal size, contour, position, consistency, mobility, tender.               Adnexa: no mass or fullness.  Has tenderness bilaterally.               Chaperone was present for exam:  Warren Lacy, CMA  ASSESSMENT  Pelvic pressure.  I suspect PID.  Possible UTI? Multiple allergies.    PLAN  Urinalysis:  sg 1.020, ph 7.0, 6-10 WBC, 0 - 2 RBC, 0 - 5 epis, few bacteria.  UC sent. CBC with diff.  Bactrim DS po bid x 3 days.  STD screening done.  Ceftriaxone 1 gram IM.  Flagyl 500 mg po bid x 14 days.  Unable to prescribe Doxycycline or Azithromycin due to allergies.  She will return if her symptoms worsen.   She has a follow up appointment already in about 2 weeks.   30 min  total time was spent for this patient encounter, including preparation, face-to-face counseling with the  patient, coordination of care, and documentation of the encounter.

## 2022-09-30 ENCOUNTER — Other Ambulatory Visit (HOSPITAL_COMMUNITY)
Admission: RE | Admit: 2022-09-30 | Discharge: 2022-09-30 | Disposition: A | Discharge status: Home / Self Care | Payer: BC Managed Care – PPO | Source: Ambulatory Visit | Attending: Obstetrics and Gynecology | Admitting: Obstetrics and Gynecology

## 2022-09-30 ENCOUNTER — Ambulatory Visit: Payer: BC Managed Care – PPO | Admitting: Obstetrics and Gynecology

## 2022-09-30 ENCOUNTER — Encounter: Payer: Self-pay | Admitting: Obstetrics and Gynecology

## 2022-09-30 VITALS — BP 108/60 | HR 90 | Temp 98.3°F | Wt 174.0 lb

## 2022-09-30 DIAGNOSIS — R102 Pelvic and perineal pain: Secondary | ICD-10-CM | POA: Diagnosis not present

## 2022-09-30 DIAGNOSIS — N3090 Cystitis, unspecified without hematuria: Secondary | ICD-10-CM

## 2022-09-30 DIAGNOSIS — Z113 Encounter for screening for infections with a predominantly sexual mode of transmission: Secondary | ICD-10-CM

## 2022-09-30 DIAGNOSIS — Z114 Encounter for screening for human immunodeficiency virus [HIV]: Secondary | ICD-10-CM | POA: Diagnosis not present

## 2022-09-30 DIAGNOSIS — Z1159 Encounter for screening for other viral diseases: Secondary | ICD-10-CM | POA: Diagnosis not present

## 2022-09-30 DIAGNOSIS — N760 Acute vaginitis: Secondary | ICD-10-CM | POA: Insufficient documentation

## 2022-09-30 DIAGNOSIS — B9689 Other specified bacterial agents as the cause of diseases classified elsewhere: Secondary | ICD-10-CM | POA: Insufficient documentation

## 2022-09-30 DIAGNOSIS — N898 Other specified noninflammatory disorders of vagina: Secondary | ICD-10-CM

## 2022-09-30 DIAGNOSIS — N739 Female pelvic inflammatory disease, unspecified: Secondary | ICD-10-CM | POA: Diagnosis not present

## 2022-09-30 LAB — URINALYSIS, COMPLETE W/RFL CULTURE
Bilirubin Urine: NEGATIVE
Glucose, UA: NEGATIVE
Hyaline Cast: NONE SEEN /LPF
Ketones, ur: NEGATIVE
Nitrites, Initial: NEGATIVE
Protein, ur: NEGATIVE
Specific Gravity, Urine: 1.02 (ref 1.001–1.035)
pH: 7 (ref 5.0–8.0)

## 2022-09-30 LAB — CBC WITH DIFFERENTIAL/PLATELET
Absolute Monocytes: 608 cells/uL (ref 200–950)
Basophils Absolute: 61 cells/uL (ref 0–200)
Basophils Relative: 0.8 %
Eosinophils Absolute: 91 cells/uL (ref 15–500)
Eosinophils Relative: 1.2 %
HCT: 43.4 % (ref 35.0–45.0)
Hemoglobin: 14.5 g/dL (ref 11.7–15.5)
Lymphs Abs: 2280 cells/uL (ref 850–3900)
MCH: 28.6 pg (ref 27.0–33.0)
MCHC: 33.4 g/dL (ref 32.0–36.0)
MCV: 85.6 fL (ref 80.0–100.0)
MPV: 13 fL — ABNORMAL HIGH (ref 7.5–12.5)
Monocytes Relative: 8 %
Neutro Abs: 4560 cells/uL (ref 1500–7800)
Neutrophils Relative %: 60 %
Platelets: 217 10*3/uL (ref 140–400)
RBC: 5.07 10*6/uL (ref 3.80–5.10)
RDW: 13.5 % (ref 11.0–15.0)
Total Lymphocyte: 30 %
WBC: 7.6 10*3/uL (ref 3.8–10.8)

## 2022-09-30 LAB — CULTURE INDICATED

## 2022-09-30 MED ORDER — METRONIDAZOLE 500 MG PO TABS: 500.0000 mg | ORAL_TABLET | Freq: Two times a day (BID) | ORAL | 0 refills | Status: AC

## 2022-09-30 MED ORDER — SULFAMETHOXAZOLE-TRIMETHOPRIM 800-160 MG PO TABS: 1.0000 | ORAL_TABLET | Freq: Two times a day (BID) | ORAL | 0 refills | Status: AC

## 2022-09-30 MED ORDER — CEFTRIAXONE SODIUM 500 MG IJ SOLR
1.0000 g | Freq: Once | INTRAMUSCULAR | Status: AC
  Administered 2022-09-30: 1 g via INTRAMUSCULAR

## 2022-09-30 NOTE — Patient Instructions (Signed)
Pelvic Inflammatory Disease  Pelvic inflammatory disease (PID) is an infection in some or all of the female reproductive organs. The infection can be in the uterus, ovaries, fallopian tubes, or the surrounding tissues in the pelvis. PID can cause abdominal or pelvic pain that comes on suddenly (acute pelvic pain). PID is a serious infection because it can lead to lasting (chronic) pelvic pain or the inability to have children (infertility). What are the causes? This condition is most often caused by bacteria that is spread during sexual contact. It can also be caused by a bacterial infection of the vagina (bacterial vaginosis) that is not spread by sexual contact. This condition occurs when the infection is not treated and the bacteria travel upward from the vagina or cervix into the reproductive organs. Bacteria may also be introduced into the reproductive organs following: The birth of a baby. A miscarriage. An abortion. Pelvic procedures or surgery. The insertion of an intrauterine device (IUD). A sexual assault. What increases the risk? You are more likely to develop this condition if you: Are younger than 52 years of age. Are sexually active at a young age. Have a history of STI (sexually transmitted infection) or PID. Have unprotected sex or do not use contraceptive barrier methods, such as condoms. Have multiple sexual partners. Have sex with someone who has symptoms of an STI. Use a douche. What are the signs or symptoms? Symptoms of this condition include: Abdominal or pelvic pain. Fever or chills. Abnormal vaginal discharge. Abnormal uterine bleeding. Unusual pain shortly after the end of a menstrual period. Pain with urination or sex. Feeling nauseous or vomiting. How is this diagnosed? This condition may be diagnosed based on: Your medical history. A pelvic exam. This exam can reveal signs of infection, inflammation, and discharge in the vagina and the surrounding  tissues. It can also help to identify painful areas. You may also have tests, including: A pregnancy test. Blood tests. A urine test. Culture tests of the vagina and cervix to check for an STI. Ultrasound. A laparoscopic procedure to look inside the pelvis. Biopsies of the lining of the uterus (endometrial biopsy). How is this treated? This condition may be treated with: Antibiotic medicines taken by mouth (orally). For more severe cases, antibiotics may be given through an IV at the hospital. Efforts to stop the spread of the infection. Sexual partners may need to be treated if the infection is caused by an STI. Surgery. This is rare. Surgery may be needed if other treatments do not help. It may take weeks until you are completely well. Your health care provider may test you for infection again 3 months after treatment. If you are diagnosed with PID, you should also be checked for HIV (human immunodeficiency virus). Follow these instructions at home: Take over-the-counter and prescription medicines only as told by your health care provider. If you were prescribed an antibiotic medicine, take it as told by your health care provider. Do not stop using the antibiotic even if you start to feel better. Do not have sex until treatment is completed or as told by your health care provider. If PID is confirmed, your recent sexual partners will need treatment, especially if you had unprotected sex. Keep all follow-up visits. This is important. Contact a health care provider if: You have increased or abnormal vaginal discharge. Your pain does not improve. You have a fever or chills. You cannot tolerate your medicines. Your partner has an STI. You have pain when you urinate. Get help  right away if: You have increased abdominal or pelvic pain. Your symptoms are getting worse. Your symptoms are not better in 72 hours with treatment. Summary Pelvic inflammatory disease (PID) is caused by an  infection in some or all of the female reproductive organs. PID is a serious infection because it can lead to lasting (chronic) pelvic pain or the inability to have children (infertility). This infection is usually treated with antibiotic medicines. Do not have sex until treatment is completed or as told by your health care provider. This information is not intended to replace advice given to you by your health care provider. Make sure you discuss any questions you have with your health care provider. Document Revised: 03/05/2021 Document Reviewed: 06/20/2020 Elsevier Patient Education  2024 [employer].

## 2022-10-13 ENCOUNTER — Ambulatory Visit (INDEPENDENT_AMBULATORY_CARE_PROVIDER_SITE_OTHER): Payer: BC Managed Care – PPO | Admitting: Radiology

## 2022-10-13 ENCOUNTER — Encounter: Payer: Self-pay | Admitting: Radiology

## 2022-10-13 VITALS — BP 118/76 | Ht 60.0 in | Wt 176.0 lb

## 2022-10-13 DIAGNOSIS — Z01419 Encounter for gynecological examination (general) (routine) without abnormal findings: Secondary | ICD-10-CM | POA: Diagnosis not present

## 2022-10-13 NOTE — Progress Notes (Signed)
   Ruth Kennedy [birth year] [id number]   History: Postmenopausal 52 y.o. presents for annual exam. Treated for PID 2 weeks ago, feeling much better. Has a few doses of flagyl left she has not taken. Cultures showed BV, negative for STIs. No new gyn concerns today. Struggles with chronic constipation, has an upcoming appt with Dr Loreta Ave.   Gynecologic History Postmenopausal Last Pap: 2023. Results were: normal, 2022 ASCUS Last mammogram: 06/04/22. Results were: normal Last colonoscopy: 2019   Obstetric History OB History  Gravida Para Term Preterm AB Living  3 2 2   1 2   SAB IAB Ectopic Multiple Live Births               # Outcome Date GA Lbr Len/2nd Weight Sex Type Anes PTL Lv  3 AB           2 Term           1 Term              The following portions of the patient's history were reviewed and updated as appropriate: allergies, current medications, past family history, past medical history, past social history, past surgical history, and problem list.  Review of Systems Pertinent items noted in HPI and remainder of comprehensive ROS otherwise negative.  Past medical history, past surgical history, family history and social history were all reviewed and documented in the EPIC chart.  Exam:  Vitals:   10/13/22 1341  BP: 118/76  Weight: 176 lb (79.8 kg)  Height: 5' (1.524 m)   Body mass index is 34.37 kg/m.  General appearance:  Normal Thyroid:  Symmetrical, normal in size, without palpable masses or nodularity. Respiratory  Auscultation:  Clear without wheezing or rhonchi Cardiovascular  Auscultation:  Regular rate, without rubs, murmurs or gallops  Edema/varicosities:  Not grossly evident Abdominal  Soft,nontender, without masses, guarding or rebound.  Liver/spleen:  No organomegaly noted  Hernia:  None appreciated  Skin  Inspection:  Grossly normal Breasts: Examined lying and sitting.   Right: Without masses, retractions, nipple discharge or axillary  adenopathy.   Left: Without masses, retractions, nipple discharge or axillary adenopathy. Genitourinary   Inguinal/mons:  Normal without inguinal adenopathy  External genitalia:  Normal appearing vulva with no masses, tenderness, or lesions  BUS/Urethra/Skene's glands:  Normal  Vagina:  Normal appearing with normal color and discharge, no lesions.   Cervix:  Normal appearing without discharge or lesions  Uterus:  Normal in size, shape and contour.  Midline and mobile, nontender  Adnexa/parametria:     Rt: Normal in size, without masses or tenderness.   Lt: Normal in size, without masses or tenderness.  Anus and perineum: Normal    Raynelle Fanning, CMA present for exam  Assessment/Plan:   1. Well woman exam with routine gynecological exam Pap 2026 PID appears to be resolved, finish last few doses of flagyl she missed Keep f/u appt with GI for constipation    Discussed SBE, colonoscopy and pap screening as directed. Recommend of exercise weekly, including weight bearing exercise. Encouraged the use of seatbelts and sunscreen.  Return in 1 year for annual or sooner prn.  Arlie Solomons B WHNP-BC, 2:07 PM 10/13/2022

## 2022-11-04 ENCOUNTER — Emergency Department (HOSPITAL_BASED_OUTPATIENT_CLINIC_OR_DEPARTMENT_OTHER)
Admission: EM | Admit: 2022-11-04 | Discharge: 2022-11-04 | Disposition: A | Discharge status: Home / Self Care | Payer: BC Managed Care – PPO | Attending: Emergency Medicine | Admitting: Emergency Medicine

## 2022-11-04 ENCOUNTER — Other Ambulatory Visit: Payer: Self-pay

## 2022-11-04 ENCOUNTER — Encounter (HOSPITAL_BASED_OUTPATIENT_CLINIC_OR_DEPARTMENT_OTHER): Payer: Self-pay

## 2022-11-04 DIAGNOSIS — Z91040 Latex allergy status: Secondary | ICD-10-CM | POA: Diagnosis not present

## 2022-11-04 DIAGNOSIS — R1012 Left upper quadrant pain: Secondary | ICD-10-CM | POA: Diagnosis not present

## 2022-11-04 DIAGNOSIS — K2950 Unspecified chronic gastritis without bleeding: Secondary | ICD-10-CM

## 2022-11-04 DIAGNOSIS — E876 Hypokalemia: Secondary | ICD-10-CM | POA: Insufficient documentation

## 2022-11-04 DIAGNOSIS — D72829 Elevated white blood cell count, unspecified: Secondary | ICD-10-CM | POA: Diagnosis not present

## 2022-11-04 DIAGNOSIS — Z91010 Allergy to peanuts: Secondary | ICD-10-CM | POA: Diagnosis not present

## 2022-11-04 DIAGNOSIS — R000 Tachycardia, unspecified: Secondary | ICD-10-CM | POA: Diagnosis not present

## 2022-11-04 LAB — URINALYSIS, ROUTINE W REFLEX MICROSCOPIC
Bilirubin Urine: NEGATIVE
Glucose, UA: NEGATIVE mg/dL
Ketones, ur: NEGATIVE mg/dL
Leukocytes,Ua: NEGATIVE
Nitrite: NEGATIVE
Protein, ur: NEGATIVE mg/dL
Specific Gravity, Urine: 1.025 (ref 1.005–1.030)
pH: 6.5 (ref 5.0–8.0)

## 2022-11-04 LAB — COMPREHENSIVE METABOLIC PANEL
ALT: 17 U/L (ref 0–44)
AST: 17 U/L (ref 15–41)
Albumin: 4 g/dL (ref 3.5–5.0)
Alkaline Phosphatase: 92 U/L (ref 38–126)
Anion gap: 12 (ref 5–15)
BUN: 12 mg/dL (ref 6–20)
CO2: 24 mmol/L (ref 22–32)
Calcium: 8.8 mg/dL — ABNORMAL LOW (ref 8.9–10.3)
Chloride: 106 mmol/L (ref 98–111)
Creatinine, Ser: 0.64 mg/dL (ref 0.44–1.00)
GFR, Estimated: >60 mL/min (ref >60)
Glucose, Bld: 157 mg/dL — ABNORMAL HIGH (ref 70–99)
Potassium: 3 mmol/L — ABNORMAL LOW (ref 3.5–5.1)
Sodium: 142 mmol/L (ref 135–145)
Total Bilirubin: 0.5 mg/dL (ref 0.3–1.2)
Total Protein: 6.8 g/dL (ref 6.5–8.1)

## 2022-11-04 LAB — CBC
HCT: 44.4 % (ref 36.0–46.0)
Hemoglobin: 15 g/dL (ref 12.0–15.0)
MCH: 29.2 pg (ref 26.0–34.0)
MCHC: 33.8 g/dL (ref 30.0–36.0)
MCV: 86.4 fL (ref 80.0–100.0)
Platelets: 237 10*3/uL (ref 150–400)
RBC: 5.14 MIL/uL — ABNORMAL HIGH (ref 3.87–5.11)
RDW: 13.9 % (ref 11.5–15.5)
WBC: 11 10*3/uL — ABNORMAL HIGH (ref 4.0–10.5)
nRBC: 0 % (ref 0.0–0.2)

## 2022-11-04 LAB — URINALYSIS, MICROSCOPIC (REFLEX): WBC, UA: NONE SEEN WBC/hpf (ref 0–5)

## 2022-11-04 LAB — LIPASE, BLOOD: Lipase: 22 U/L (ref 11–51)

## 2022-11-04 MED ORDER — POTASSIUM CHLORIDE CRYS ER 20 MEQ PO TBCR
20.0000 meq | EXTENDED_RELEASE_TABLET | Freq: Once | ORAL | Status: DC
  Filled 2022-11-04: qty 1

## 2022-11-04 MED ORDER — SENNOSIDES-DOCUSATE SODIUM 8.6-50 MG PO TABS: 1.0000 | ORAL_TABLET | Freq: Every day | ORAL | 0 refills | Status: AC

## 2022-11-04 MED ORDER — PANTOPRAZOLE SODIUM 40 MG PO TBEC
40.0000 mg | DELAYED_RELEASE_TABLET | Freq: Once | ORAL | Status: AC
  Administered 2022-11-04: 40 mg via ORAL
  Filled 2022-11-04: qty 1

## 2022-11-04 MED ORDER — KETOROLAC TROMETHAMINE 15 MG/ML IJ SOLN
15.0000 mg | Freq: Once | INTRAMUSCULAR | Status: AC
  Administered 2022-11-04: 15 mg via INTRAVENOUS
  Filled 2022-11-04: qty 1

## 2022-11-04 MED ORDER — PANTOPRAZOLE SODIUM 40 MG PO TBEC: 40.0000 mg | DELAYED_RELEASE_TABLET | Freq: Every day | ORAL | 0 refills | Status: DC

## 2022-11-04 MED ORDER — POTASSIUM CHLORIDE CRYS ER 20 MEQ PO TBCR: 20.0000 meq | EXTENDED_RELEASE_TABLET | Freq: Once | ORAL | 0 refills | Status: DC

## 2022-11-04 MED ORDER — POTASSIUM CHLORIDE 20 MEQ PO PACK: 20.0000 meq | PACK | Freq: Every day | ORAL | 0 refills | Status: AC

## 2022-11-04 MED ORDER — POTASSIUM CHLORIDE 20 MEQ PO PACK
20.0000 meq | PACK | Freq: Once | ORAL | Status: AC
  Administered 2022-11-04: 20 meq via ORAL
  Filled 2022-11-04: qty 1

## 2022-11-04 MED ORDER — SODIUM CHLORIDE 0.9 % IV BOLUS
500.0000 mL | Freq: Once | INTRAVENOUS | Status: AC
  Administered 2022-11-04: 500 mL via INTRAVENOUS

## 2022-11-04 NOTE — Discharge Instructions (Addendum)
Please begin taking Protonix 40 mg each morning at least 1 hour prior to eating.  Please do not take Pepcid at this time.  You may use Tums as needed for acid reflux pain.  Continue to use MiraLAX for constipation, I will send a prescription in for senna docusate to help with the constipation as well.  Your potassium is low on lab work, will be prescription prescribed a total of 5 days of potassium replacement, you will receive the first dose here.  Follow-up with your primary care provider in 1 to 2 weeks.  Follow-up with outpatient GI in 30 days or sooner.

## 2022-11-04 NOTE — ED Triage Notes (Signed)
Pt reports that she has been having LUQ pain for the past two day, denies n/v/d, some constipation.

## 2022-11-04 NOTE — ED Provider Notes (Cosign Needed Addendum)
[facility] Provider Note   CSN: [id number] Arrival date & time: 11/04/22  1926     History  Chief Complaint  Patient presents with   Abdominal Pain    Ruth Kennedy is a 52 y.o. female with a past medical history of GERD, gastritis, chronic constipation who presents emergency department for evaluation of left upper quadrant abdominal pain that feels like "trapped gas "that has been present for the past 2.5 days.  Patient stated that she has been experiencing an increase in belching, bloating, acidic feeling in her throat, and that her left upper quadrant pain worsened today after eating at [employer].  Her last bowel movement was this morning.  She denied nausea, vomiting, passing of gas, dysuria, increased frequency, hematochezia, melena, or recent use of NSAIDs.  Patient was recently diagnosed with GERD and gastritis after she underwent an EGD and colonoscopy in August.  Per patient, she denies history of ulcers.  She is currently on Pepcid, Gas-X, Tums; all which have helped with her abdominal pain and colicky gas pain.  She denies abdominal surgery except for tubal ligation years ago.  She denies history of small bowel obstruction or large bowel obstruction in the past.    Home Medications Prior to Admission medications   Medication Sig Start Date End Date Taking? Authorizing Provider  albuterol (VENTOLIN HFA) 108 (90 Base) MCG/ACT inhaler Inhale 2 puffs into the lungs 4 (four) times daily. 05/27/20   [provider]  COCONUT OIL PO Take 1 capsule by mouth daily.    [provider]  FIBER PO Take by mouth 3 (three) times daily.    [provider]  fluticasone (FLONASE) 50 MCG/ACT nasal spray Place 1 spray into both nostrils daily. 12/21/17   Liberty Handy, PA-C  loratadine (CLARITIN) 10 MG tablet Take 10 mg by mouth daily.    [provider]  metroNIDAZOLE (FLAGYL) 500 MG tablet Take 1  tablet (500 mg total) by mouth 2 (two) times daily. Take twice daily for 14 days. 09/30/22   Patton Salles, MD  Multiple Vitamins-Minerals (ONE-A-DAY MENOPAUSE FORMULA PO) Take 1 tablet by mouth daily.    [provider]  Omega-3 Fatty Acids (FISH OIL) 1000 MG CAPS Take 1 capsule by mouth daily.    [provider]  pantoprazole (PROTONIX) 40 MG tablet Take 1 tablet by mouth daily. 05/27/20   [provider]  Probiotic Product (PROBIOTIC PO) Take by mouth daily.    [provider]  Roflumilast (ZORYVE) 0.3 % CREA Apply topically.    [provider]  Simethicone (GAS-X PO) Take 1 tablet by mouth as needed.    [provider]  sulfamethoxazole-trimethoprim (BACTRIM DS) 800-160 MG tablet Take 1 tablet by mouth 2 (two) times daily. One PO BID x 3 days 09/30/22   Ardell Isaacs, Forrestine Him, MD  venlafaxine XR (EFFEXOR XR) 37.5 MG 24 hr capsule Take 1 capsule (37.5 mg total) by mouth daily. 10/13/21   Patton Salles, MD      Allergies    Azithromycin, Nickel, Peanut oil, Protonix [pantoprazole sodium], Doxycycline, Latex, and Penicillins    Review of Systems   Review of Systems  Constitutional:  Negative for chills and fever.  Respiratory:  Negative for shortness of breath.   Cardiovascular:  Negative for chest pain.  Gastrointestinal:  Positive for abdominal pain (Left upper quadrant) and constipation. Negative for blood in stool, diarrhea,  nausea and vomiting.  Genitourinary:  Negative for dysuria, frequency and urgency.    Physical Exam Updated Vital Signs BP (!) 137/96   Pulse (!) 116   Temp 98.6 F (37 C) (Oral)   Resp 16   LMP 06/15/2021 Comment: spotting x1 day only  SpO2 96%  Physical Exam Vitals reviewed.  Constitutional:      General: She is not in acute distress.    Appearance: She is obese. She is not ill-appearing, toxic-appearing or diaphoretic.  HENT:     Head: Normocephalic.  Cardiovascular:     Rate  and Rhythm: Normal rate and regular rhythm.     Heart sounds: Normal heart sounds.  Pulmonary:     Effort: Pulmonary effort is normal. No respiratory distress.     Breath sounds: Normal breath sounds. No wheezing.  Abdominal:     General: Abdomen is protuberant. Bowel sounds are normal. There is no distension.     Palpations: Abdomen is soft.     Tenderness: There is abdominal tenderness in the left upper quadrant. There is no guarding.  Skin:    General: Skin is warm and dry.  Neurological:     Mental Status: She is alert and oriented to person, place, and time.     ED Results / Procedures / Treatments   Labs (all labs ordered are listed, but only abnormal results are displayed) Labs Reviewed  COMPREHENSIVE METABOLIC PANEL - Abnormal; Notable for the following components:      Result Value   Potassium 3.0 (*)    Glucose, Bld 157 (*)    Calcium 8.8 (*)    All other components within normal limits  CBC - Abnormal; Notable for the following components:   WBC 11.0 (*)    RBC 5.14 (*)    All other components within normal limits  LIPASE, BLOOD  URINALYSIS, ROUTINE W REFLEX MICROSCOPIC    EKG EKG Interpretation Date/Time:  Wednesday November 04 2022 19:41:29 EDT Ventricular Rate:  111 PR Interval:  154 QRS Duration:  72 QT Interval:  338 QTC Calculation: 459 R Axis:   61  Text Interpretation: Sinus tachycardia Otherwise normal ECG When compared with ECG of 09-Mar-2015 10:39, PREVIOUS ECG IS PRESENT Confirmed by Glyn Ade [phone]) on 11/04/2022 8:25:49 PM  Radiology No results found.  Procedures Procedures    Medications Ordered in ED Medications  pantoprazole (PROTONIX) EC tablet 40 mg (40 mg Oral Given 11/04/22 2036)    ED Course/ Medical Decision Making/ A&P Clinical Course as of 11/04/22 2255  Wed Nov 04, 2022  2024 Stable (Resident) 16 YOF with hx of gastritis and here with [facility] abdominal pain.  Exam benign. AP: Likely recurrent gastritis. Planning  for medical therapy.   [CC]  2254 Hgb urine dipstick(!): TRACE [EB]    Clinical Course User Index [CC] Glyn Ade, MD [EB] Faith Rogue, DO                                 Medical Decision Making Patient is a 52 year old female with a relevant past medical history of GERD and gastritis who presents emergency department for evaluation of left upper quadrant pain was originally ranked as a 8-9 out of 10.  Physical exam is remarkable for left upper quadrant pain on palpation and belching throughout exam.  After administration of Protonix, patient's pain improved to a 5/10, after receiving 500 cc of IVF and 15 mg of IV  Toradol, her pain has now improved to a 3.  Her pain is most likely due to recurrent gastritis.  Other etiologies considered are pancreatitis, lipase is within normal limits.  Bowel obstruction is also considered however she was passing stool this morning, less likely at this moment.  Acute cholecystitis also considered but unlikely without fever or right upper quadrant pain and pain was resolved with Protonix.  UTI at this moment is unlikely, urinalysis is likely infected due to presence of rare bacteria with squamous epithelium and patient is asymptomatic.  Will not treat for asymptomatic UTI at this moment.  Patient was discharged home with a prescription for 40 mg Protonix once a day, potassium 20 meq for 4 days, and with a prescription for senna docusate for constipation.  Protonix was prescribed for gastritis, her allergy is  listed as "back pain, she denies anaphylaxis reaction , hives, or SOB. Patient-physician decision to continue with Protonix due to no SOB or anaphylaxis reaction.  Patient does have a small hemoglobinuria, patient will follow-up with PCP repeat urinalysis and repeat BMP.   Amount and/or Complexity of Data Reviewed Labs: ordered. Decision-making details documented in ED Course.    Details: Lipase is within normal limits Patient has slight  leukocytosis with WBC at 11.0, likely hemoconcentration vs reactive Potassium is low at 3.0 Urinalysis shows rare bacteria but as squamous epithelium present, likely contaminated Analysis also shows trace hemoglobinuria without RBC present.  Risk OTC drugs. Prescription drug management.           Final Clinical Impression(s) / ED Diagnoses Final diagnoses:  None    Rx / DC Orders ED Discharge Orders     None         Faith Rogue, DO 11/04/22 2311    Faith Rogue, DO 11/04/22 2348    Glyn Ade, MD 11/05/22 [phone]

## 2022-11-05 ENCOUNTER — Other Ambulatory Visit: Payer: Self-pay

## 2022-11-05 ENCOUNTER — Other Ambulatory Visit: Payer: Self-pay | Admitting: Student

## 2022-11-05 ENCOUNTER — Encounter (HOSPITAL_BASED_OUTPATIENT_CLINIC_OR_DEPARTMENT_OTHER): Payer: Self-pay | Admitting: Pediatrics

## 2022-11-05 ENCOUNTER — Emergency Department (HOSPITAL_BASED_OUTPATIENT_CLINIC_OR_DEPARTMENT_OTHER)
Admission: EM | Admit: 2022-11-05 | Discharge: 2022-11-05 | Disposition: A | Discharge status: Home / Self Care | Payer: BC Managed Care – PPO | Attending: Emergency Medicine | Admitting: Emergency Medicine

## 2022-11-05 DIAGNOSIS — R9431 Abnormal electrocardiogram [ECG] [EKG]: Secondary | ICD-10-CM | POA: Diagnosis not present

## 2022-11-05 DIAGNOSIS — K219 Gastro-esophageal reflux disease without esophagitis: Secondary | ICD-10-CM | POA: Insufficient documentation

## 2022-11-05 DIAGNOSIS — R1012 Left upper quadrant pain: Secondary | ICD-10-CM | POA: Diagnosis not present

## 2022-11-05 DIAGNOSIS — Z91010 Allergy to peanuts: Secondary | ICD-10-CM | POA: Diagnosis not present

## 2022-11-05 DIAGNOSIS — Z91040 Latex allergy status: Secondary | ICD-10-CM | POA: Diagnosis not present

## 2022-11-05 LAB — CBC
HCT: 43.1 % (ref 36.0–46.0)
Hemoglobin: 14.3 g/dL (ref 12.0–15.0)
MCH: 29 pg (ref 26.0–34.0)
MCHC: 33.2 g/dL (ref 30.0–36.0)
MCV: 87.4 fL (ref 80.0–100.0)
Platelets: 210 10*3/uL (ref 150–400)
RBC: 4.93 MIL/uL (ref 3.87–5.11)
RDW: 14.1 % (ref 11.5–15.5)
WBC: 9.9 10*3/uL (ref 4.0–10.5)
nRBC: 0 % (ref 0.0–0.2)

## 2022-11-05 LAB — COMPREHENSIVE METABOLIC PANEL
ALT: 17 U/L (ref 0–44)
AST: 15 U/L (ref 15–41)
Albumin: 3.8 g/dL (ref 3.5–5.0)
Alkaline Phosphatase: 90 U/L (ref 38–126)
Anion gap: 9 (ref 5–15)
BUN: 12 mg/dL (ref 6–20)
CO2: 22 mmol/L (ref 22–32)
Calcium: 8.8 mg/dL — ABNORMAL LOW (ref 8.9–10.3)
Chloride: 108 mmol/L (ref 98–111)
Creatinine, Ser: 0.49 mg/dL (ref 0.44–1.00)
GFR, Estimated: >60 mL/min (ref >60)
Glucose, Bld: 116 mg/dL — ABNORMAL HIGH (ref 70–99)
Potassium: 3.8 mmol/L (ref 3.5–5.1)
Sodium: 139 mmol/L (ref 135–145)
Total Bilirubin: 0.5 mg/dL (ref 0.3–1.2)
Total Protein: 6.6 g/dL (ref 6.5–8.1)

## 2022-11-05 LAB — LIPASE, BLOOD: Lipase: 21 U/L (ref 11–51)

## 2022-11-05 MED ORDER — FAMOTIDINE 20 MG PO TABS
20.0000 mg | ORAL_TABLET | Freq: Once | ORAL | Status: DC
  Filled 2022-11-05: qty 1

## 2022-11-05 MED ORDER — ACETAMINOPHEN 325 MG PO TABS
650.0000 mg | ORAL_TABLET | Freq: Once | ORAL | Status: AC
  Administered 2022-11-05: 650 mg via ORAL
  Filled 2022-11-05: qty 2

## 2022-11-05 MED ORDER — ACETAMINOPHEN 325 MG PO TABS
650.0000 mg | ORAL_TABLET | Freq: Four times a day (QID) | ORAL | 0 refills | Status: AC | PRN
Start: 2022-11-05 — End: ?

## 2022-11-05 MED ORDER — ALUM & MAG HYDROXIDE-SIMETH 200-200-20 MG/5ML PO SUSP
15.0000 mL | Freq: Once | ORAL | Status: AC
  Administered 2022-11-05: 15 mL via ORAL
  Filled 2022-11-05: qty 30

## 2022-11-05 MED ORDER — FAMOTIDINE 20 MG PO TABS
20.0000 mg | ORAL_TABLET | Freq: Two times a day (BID) | ORAL | 0 refills | Status: AC
Start: 2022-11-05 — End: ?

## 2022-11-05 NOTE — Discharge Instructions (Addendum)
Your labs were unremarkable today. Please take tylenol for pain. Continue to take your Pepcid and Protonix as prescribed.  I recommend close follow-up with PCP and gastroenterology for reevaluation.  Please do not hesitate to return to emergency department if worrisome signs symptoms we discussed become apparent.

## 2022-11-05 NOTE — ED Triage Notes (Signed)
Stated was seen last night for same issue. States she ate some ice cream before the pain came back with full force.

## 2022-11-06 NOTE — ED Provider Notes (Signed)
[place] [facility] Provider Note   CSN: [id number] Arrival date & time: 11/05/22  1527     History  Chief Complaint  Patient presents with   Abdominal Pain    Ruth Kennedy is a 52 y.o. female with a past medical history of GERD, gastritis, chronic constipation presents today for evaluation of left upper quadrant pain.  Patient states that her symptom has been going on in the last 3 days.  She was seen yesterday in the ED and was discharged with Protonix.  Patient returns today after having ice cream and stated that the pain came back.  She described the pain as sharp, stabbing pain in her left upper quadrant.  Denies any vomiting, fever, chest pain, shortness of breath, bowel changes, urinary symptoms, rash.   Abdominal Pain      Home Medications Prior to Admission medications   Medication Sig Start Date End Date Taking? Authorizing Provider  acetaminophen (TYLENOL) 325 MG tablet Take 2 tablets (650 mg total) by mouth every 6 (six) hours as needed. 11/05/22  Yes Jeanelle Malling, PA  famotidine (PEPCID) 20 MG tablet Take 1 tablet (20 mg total) by mouth 2 (two) times daily. 11/05/22  Yes Jeanelle Malling, PA  albuterol (VENTOLIN HFA) 108 (90 Base) MCG/ACT inhaler Inhale 2 puffs into the lungs 4 (four) times daily. 05/27/20   [provider]  COCONUT OIL PO Take 1 capsule by mouth daily.    [provider]  FIBER PO Take by mouth 3 (three) times daily.    [provider]  fluticasone (FLONASE) 50 MCG/ACT nasal spray Place 1 spray into both nostrils daily. 12/21/17   Liberty Handy, PA-C  loratadine (CLARITIN) 10 MG tablet Take 10 mg by mouth daily.    [provider]  metroNIDAZOLE (FLAGYL) 500 MG tablet Take 1 tablet (500 mg total) by mouth 2 (two) times daily. Take twice daily for 14 days. 09/30/22   Patton Salles, MD  Multiple Vitamins-Minerals (ONE-A-DAY MENOPAUSE FORMULA PO) Take 1 tablet by mouth  daily.    [provider]  Omega-3 Fatty Acids (FISH OIL) 1000 MG CAPS Take 1 capsule by mouth daily.    [provider]  pantoprazole (PROTONIX) 40 MG tablet TAKE 1 TABLET(40 MG) BY MOUTH DAILY 11/05/22 12/05/22  Faith Rogue, DO  potassium chloride (KLOR-CON) 20 MEQ packet Take 20 mEq by mouth daily for 4 days. 11/04/22 11/08/22  Faith Rogue, DO  Probiotic Product (PROBIOTIC PO) Take by mouth daily.    [provider]  Roflumilast (ZORYVE) 0.3 % CREA Apply topically.    [provider]  senna-docusate (SENOKOT-S) 8.6-50 MG tablet Take 1 tablet by mouth daily. 11/04/22   Faith Rogue, DO  Simethicone (GAS-X PO) Take 1 tablet by mouth as needed.    [provider]  sulfamethoxazole-trimethoprim (BACTRIM DS) 800-160 MG tablet Take 1 tablet by mouth 2 (two) times daily. One PO BID x 3 days 09/30/22   Ardell Isaacs, Forrestine Him, MD  venlafaxine XR (EFFEXOR XR) 37.5 MG 24 hr capsule Take 1 capsule (37.5 mg total) by mouth daily. 10/13/21   Patton Salles, MD      Allergies    Azithromycin, Nickel, Peanut oil, Protonix [pantoprazole sodium], Doxycycline, Latex, and Penicillins    Review of Systems   Review of Systems  Gastrointestinal:  Positive for abdominal pain.    Physical Exam Updated Vital Signs BP (!) 155/103 (BP Location:  Left Arm)   Pulse (!) 102   Temp 98 F (36.7 C) (Oral)   Resp 18   Ht 5' (1.524 m)   Wt 77.1 kg   LMP 06/15/2021 Comment: spotting x1 day only  SpO2 98%   BMI 33.20 kg/m  Physical Exam Vitals and nursing note reviewed.  Constitutional:      Appearance: Normal appearance.  HENT:     Head: Normocephalic and atraumatic.     Mouth/Throat:     Mouth: Mucous membranes are moist.  Eyes:     General: No scleral icterus. Cardiovascular:     Rate and Rhythm: Normal rate and regular rhythm.     Pulses: Normal pulses.     Heart sounds: Normal heart sounds.  Pulmonary:     Effort: Pulmonary effort is normal.      Breath sounds: Normal breath sounds.  Abdominal:     General: Abdomen is flat.     Palpations: Abdomen is soft.     Tenderness: There is abdominal tenderness in the left upper quadrant.  Musculoskeletal:        General: No deformity.  Skin:    General: Skin is warm.     Findings: No rash.  Neurological:     General: No focal deficit present.     Mental Status: She is alert.  Psychiatric:        Mood and Affect: Mood normal.     ED Results / Procedures / Treatments   Labs (all labs ordered are listed, but only abnormal results are displayed) Labs Reviewed  COMPREHENSIVE METABOLIC PANEL - Abnormal; Notable for the following components:      Result Value   Glucose, Bld 116 (*)    Calcium 8.8 (*)    All other components within normal limits  LIPASE, BLOOD  CBC    EKG None  Radiology No results found.  Procedures Procedures    Medications Ordered in ED Medications  alum & mag hydroxide-simeth (MAALOX/MYLANTA) 200-200-20 MG/5ML suspension 15 mL (15 mLs Oral Given 11/05/22 1817)  acetaminophen (TYLENOL) tablet 650 mg (650 mg Oral Given 11/05/22 1816)    ED Course/ Medical Decision Making/ A&P                                 Medical Decision Making Amount and/or Complexity of Data Reviewed Labs: ordered.  Risk OTC drugs.   This patient presents to the ED for abdominal pain, this involves an extensive number of treatment options, and is a complaint that carries with a high risk of complications and morbidity.  The differential diagnosis includes pancreatitis, bowel obstruction, cholecystitis, gastritis, GERD, PUD.  This is not an exhaustive list.  Lab tests: I ordered and personally interpreted labs.  The pertinent results include: WBC unremarkable. Hbg unremarkable. Platelets unremarkable. Electrolytes unremarkable. BUN, creatinine unremarkable.  Lipase normal.  Problem list/ ED course/ Critical interventions/ Medical management: HPI: See above Vital  signs within normal range and stable throughout visit. Laboratory/imaging studies significant for: See above. On physical examination, patient is afebrile and appears in no acute distress.  There was tenderness to palpation to the left upper quadrant.  Patient was evaluated yesterday for similar symptoms.  CBC with no leukocytosis or anemia.  CMP with no acute electrolytes or evidence of AKI or transaminitis.  Considered cholecystitis but unlikely given lack of fever and patient does not have pain in the epigastrium or right upper quadrant.  Lipase normal liver function tests are normal, low suspicion for pancreatitis.  Low suspicion for SBO, last bowel meant was earlier today.  Symptoms are likely secondary to gastritis versus PUD.  Low suspicion for any other acute emergent causes of abdominal pain at this point.  Given Pepcid and Maalox. Advised patient to take her Pepcid and Protonix as prescribed, follow-up with primary care physician or gastroenterology for further evaluation and management, return to the ER if new or worsening symptoms. I have reviewed the patient home medicines and have made adjustments as needed.  Cardiac monitoring/EKG: The patient was maintained on a cardiac monitor.  I personally reviewed and interpreted the cardiac monitor which showed an underlying rhythm of: sinus rhythm.  Additional history obtained: External records from outside source obtained and reviewed including: Chart review including previous notes, labs, imaging.  Consultations obtained:  Disposition Continued outpatient therapy. Follow-up with PCP/gastroenterology recommended for reevaluation of symptoms. Treatment plan discussed with patient.  Pt acknowledged understanding was agreeable to the plan. Worrisome signs and symptoms were discussed with patient, and patient acknowledged understanding to return to the ED if they noticed these signs and symptoms. Patient was stable upon discharge.   This chart was  dictated using voice recognition software.  Despite best efforts to proofread,  errors can occur which can change the documentation meaning.          Final Clinical Impression(s) / ED Diagnoses Final diagnoses:  Left upper quadrant abdominal pain    Rx / DC Orders ED Discharge Orders          Ordered    famotidine (PEPCID) 20 MG tablet  2 times daily        11/05/22 1817    acetaminophen (TYLENOL) 325 MG tablet  Every 6 hours PRN        11/05/22 1817              Jeanelle Malling, PA 11/06/22 1034    Sloan Leiter, DO 11/08/22 2354

## 2023-05-06 DIAGNOSIS — L820 Inflamed seborrheic keratosis: Secondary | ICD-10-CM | POA: Diagnosis not present

## 2023-05-06 DIAGNOSIS — D485 Neoplasm of uncertain behavior of skin: Secondary | ICD-10-CM | POA: Diagnosis not present

## 2023-05-06 DIAGNOSIS — D2371 Other benign neoplasm of skin of right lower limb, including hip: Secondary | ICD-10-CM | POA: Diagnosis not present

## 2023-05-06 DIAGNOSIS — L403 Pustulosis palmaris et plantaris: Secondary | ICD-10-CM | POA: Diagnosis not present

## 2023-05-21 DIAGNOSIS — D539 Nutritional anemia, unspecified: Secondary | ICD-10-CM | POA: Diagnosis not present

## 2023-05-21 DIAGNOSIS — Z0000 Encounter for general adult medical examination without abnormal findings: Secondary | ICD-10-CM | POA: Diagnosis not present

## 2023-05-21 DIAGNOSIS — R0609 Other forms of dyspnea: Secondary | ICD-10-CM | POA: Diagnosis not present

## 2023-05-21 DIAGNOSIS — R350 Frequency of micturition: Secondary | ICD-10-CM | POA: Diagnosis not present

## 2023-05-21 DIAGNOSIS — Z6836 Body mass index (BMI) 36.0-36.9, adult: Secondary | ICD-10-CM | POA: Diagnosis not present

## 2023-05-21 DIAGNOSIS — E559 Vitamin D deficiency, unspecified: Secondary | ICD-10-CM | POA: Diagnosis not present

## 2023-05-21 DIAGNOSIS — E7800 Pure hypercholesterolemia, unspecified: Secondary | ICD-10-CM | POA: Diagnosis not present

## 2023-05-21 DIAGNOSIS — Z87891 Personal history of nicotine dependence: Secondary | ICD-10-CM | POA: Diagnosis not present

## 2023-06-08 DIAGNOSIS — R3129 Other microscopic hematuria: Secondary | ICD-10-CM | POA: Diagnosis not present

## 2023-06-08 DIAGNOSIS — Z87891 Personal history of nicotine dependence: Secondary | ICD-10-CM | POA: Diagnosis not present

## 2023-06-08 DIAGNOSIS — Z91018 Allergy to other foods: Secondary | ICD-10-CM | POA: Diagnosis not present

## 2023-06-08 DIAGNOSIS — E559 Vitamin D deficiency, unspecified: Secondary | ICD-10-CM | POA: Diagnosis not present

## 2023-06-08 DIAGNOSIS — E538 Deficiency of other specified B group vitamins: Secondary | ICD-10-CM | POA: Diagnosis not present

## 2023-06-08 DIAGNOSIS — Z6836 Body mass index (BMI) 36.0-36.9, adult: Secondary | ICD-10-CM | POA: Diagnosis not present

## 2023-06-08 DIAGNOSIS — E7800 Pure hypercholesterolemia, unspecified: Secondary | ICD-10-CM | POA: Diagnosis not present

## 2023-06-12 DIAGNOSIS — R3129 Other microscopic hematuria: Secondary | ICD-10-CM | POA: Diagnosis not present

## 2023-07-08 DIAGNOSIS — J302 Other seasonal allergic rhinitis: Secondary | ICD-10-CM | POA: Diagnosis not present

## 2023-07-08 DIAGNOSIS — K644 Residual hemorrhoidal skin tags: Secondary | ICD-10-CM | POA: Diagnosis not present

## 2023-07-08 DIAGNOSIS — Z6836 Body mass index (BMI) 36.0-36.9, adult: Secondary | ICD-10-CM | POA: Diagnosis not present

## 2023-07-08 DIAGNOSIS — K5900 Constipation, unspecified: Secondary | ICD-10-CM | POA: Diagnosis not present

## 2023-07-26 DIAGNOSIS — Z720 Tobacco use: Secondary | ICD-10-CM | POA: Diagnosis not present

## 2023-07-26 DIAGNOSIS — R942 Abnormal results of pulmonary function studies: Secondary | ICD-10-CM | POA: Diagnosis not present

## 2023-07-26 DIAGNOSIS — Z9109 Other allergy status, other than to drugs and biological substances: Secondary | ICD-10-CM | POA: Diagnosis not present

## 2023-07-26 DIAGNOSIS — R918 Other nonspecific abnormal finding of lung field: Secondary | ICD-10-CM | POA: Diagnosis not present

## 2023-07-27 DIAGNOSIS — R222 Localized swelling, mass and lump, trunk: Secondary | ICD-10-CM | POA: Diagnosis not present

## 2023-07-27 DIAGNOSIS — R918 Other nonspecific abnormal finding of lung field: Secondary | ICD-10-CM | POA: Diagnosis not present

## 2023-08-17 ENCOUNTER — Other Ambulatory Visit: Payer: Self-pay | Admitting: General Practice

## 2023-08-17 DIAGNOSIS — Z1231 Encounter for screening mammogram for malignant neoplasm of breast: Secondary | ICD-10-CM

## 2023-08-18 ENCOUNTER — Ambulatory Visit
Admission: RE | Admit: 2023-08-18 | Discharge: 2023-08-18 | Discharge status: Home / Self Care | Source: Ambulatory Visit | Attending: General Practice | Admitting: General Practice

## 2023-08-18 DIAGNOSIS — Z1231 Encounter for screening mammogram for malignant neoplasm of breast: Secondary | ICD-10-CM

## 2023-08-23 ENCOUNTER — Other Ambulatory Visit: Payer: Self-pay | Admitting: Nurse Practitioner

## 2023-08-23 DIAGNOSIS — R928 Other abnormal and inconclusive findings on diagnostic imaging of breast: Secondary | ICD-10-CM

## 2023-08-24 DIAGNOSIS — R918 Other nonspecific abnormal finding of lung field: Secondary | ICD-10-CM | POA: Diagnosis not present

## 2023-08-24 DIAGNOSIS — J9801 Acute bronchospasm: Secondary | ICD-10-CM | POA: Diagnosis not present

## 2023-08-30 ENCOUNTER — Ambulatory Visit
Admission: RE | Admit: 2023-08-30 | Discharge: 2023-08-30 | Disposition: A | Discharge status: Home / Self Care | Source: Ambulatory Visit | Attending: Nurse Practitioner | Admitting: Nurse Practitioner

## 2023-08-30 ENCOUNTER — Other Ambulatory Visit: Payer: Self-pay | Admitting: Nurse Practitioner

## 2023-08-30 DIAGNOSIS — R921 Mammographic calcification found on diagnostic imaging of breast: Secondary | ICD-10-CM

## 2023-08-30 DIAGNOSIS — R928 Other abnormal and inconclusive findings on diagnostic imaging of breast: Secondary | ICD-10-CM

## 2023-09-02 ENCOUNTER — Ambulatory Visit
Admission: RE | Admit: 2023-09-02 | Discharge: 2023-09-02 | Disposition: A | Discharge status: Home / Self Care | Source: Ambulatory Visit | Attending: Nurse Practitioner | Admitting: Nurse Practitioner

## 2023-09-02 DIAGNOSIS — R921 Mammographic calcification found on diagnostic imaging of breast: Secondary | ICD-10-CM

## 2023-09-06 ENCOUNTER — Other Ambulatory Visit: Payer: Self-pay | Admitting: Nurse Practitioner

## 2023-09-06 DIAGNOSIS — R921 Mammographic calcification found on diagnostic imaging of breast: Secondary | ICD-10-CM

## 2023-09-27 ENCOUNTER — Encounter: Payer: Self-pay | Admitting: Radiology

## 2023-09-27 ENCOUNTER — Ambulatory Visit: Admitting: Radiology

## 2023-09-27 VITALS — BP 128/72 | HR 85 | Temp 98.4°F | Wt 184.0 lb

## 2023-09-27 DIAGNOSIS — N898 Other specified noninflammatory disorders of vagina: Secondary | ICD-10-CM

## 2023-09-27 DIAGNOSIS — K5900 Constipation, unspecified: Secondary | ICD-10-CM

## 2023-09-27 DIAGNOSIS — R102 Pelvic and perineal pain: Secondary | ICD-10-CM | POA: Diagnosis not present

## 2023-09-27 DIAGNOSIS — R300 Dysuria: Secondary | ICD-10-CM | POA: Diagnosis not present

## 2023-09-27 LAB — URINALYSIS, COMPLETE W/RFL CULTURE
Bacteria, UA: NONE SEEN /HPF
Bilirubin Urine: NEGATIVE
Glucose, UA: NEGATIVE
Hyaline Cast: NONE SEEN /LPF
Ketones, ur: NEGATIVE
Leukocyte Esterase: NEGATIVE
Nitrites, Initial: NEGATIVE
Protein, ur: NEGATIVE
RBC / HPF: NONE SEEN /HPF (ref 0–2)
Specific Gravity, Urine: 1.01 (ref 1.001–1.035)
WBC, UA: NONE SEEN /HPF (ref 0–5)
pH: 6.5 (ref 5.0–8.0)

## 2023-09-27 LAB — WET PREP FOR TRICH, YEAST, CLUE

## 2023-09-27 LAB — NO CULTURE INDICATED

## 2023-09-27 NOTE — Progress Notes (Signed)
   Ruth Kennedy 1970/05/03 [id number]   History:  53 y.o. G3P2 presents with c/o long term pelvic and abdominal pain. Scant vaginal discharge, slight burning with urination. Severe constipation, not taking Linzess as prescribed because she works 7 days a week. Has not seen GI in almost 3 years. Concerned she has a systemic infection because the stool is so backed up.   Gynecologic History Patient's last menstrual period was 06/15/2021.     Obstetric History OB History  Gravida Para Term Preterm AB Living  3 2 2  1 2   SAB IAB Ectopic Multiple Live Births          # Outcome Date GA Lbr Len/2nd Weight Sex Type Anes PTL Lv  3 AB           2 Term           1 Term               The following portions of the patient's history were reviewed and updated as appropriate: allergies, current medications, past family history, past medical history, past social history, past surgical history, and problem list.  Review of Systems  All other systems reviewed and are negative.   Past medical history, past surgical history, family history and social history were all reviewed and documented in the EPIC chart.  Exam:  Vitals:   09/27/23 0933  BP: 128/72  Pulse: 85  Temp: 98.4 F (36.9 C)  SpO2: 99%  Weight: 184 lb (83.5 kg)   Body mass index is 35.94 kg/m.  Physical Exam Vitals and nursing note reviewed. Exam conducted with a chaperone present.  Constitutional:      Appearance: Normal appearance. She is well-developed.  Pulmonary:     Effort: Pulmonary effort is normal.  Abdominal:     General: Abdomen is flat.     Palpations: Abdomen is soft.  Genitourinary:    General: Normal vulva.     Vagina: Vaginal discharge present. No erythema, bleeding or lesions.     Cervix: Normal. No discharge, friability, lesion or erythema.     Uterus: Normal.      Adnexa:        Right: Fullness present.        Left: Fullness present.      Comments: Unable to feel ovaries due to  constipation Neurological:     Mental Status: She is alert.  Psychiatric:        Mood and Affect: Mood normal.        Thought Content: Thought content normal.        Judgment: Judgment normal.     Heinz Senters, CMA present for exam  Urine dipstick shows positive for RBC's.  Micro exam: negative for WBC's or RBC's.   Assessment/Plan:   1. Dysuria (Primary) - Urinalysis,Complete w/RFL Culture  2. Pelvic pain - US  PELVIS TRANSVAGINAL NON-OB (TV ONLY); Future  3. Vaginal discharge - WET PREP FOR TRICH, YEAST, CLUE  4. Constipation, unspecified constipation type Follow up with GI, Dr Kristie Discussed the importance of taking the Linzess to move her bowels regularly   Return for u/s only.  [name] B WHNP-BC 10:00 AM 09/27/2023

## 2023-09-28 ENCOUNTER — Other Ambulatory Visit

## 2023-09-28 DIAGNOSIS — Z860100 Personal history of colon polyps, unspecified: Secondary | ICD-10-CM | POA: Diagnosis not present

## 2023-09-28 DIAGNOSIS — K5904 Chronic idiopathic constipation: Secondary | ICD-10-CM | POA: Diagnosis not present

## 2023-09-28 DIAGNOSIS — Z1211 Encounter for screening for malignant neoplasm of colon: Secondary | ICD-10-CM | POA: Diagnosis not present

## 2023-09-28 DIAGNOSIS — K219 Gastro-esophageal reflux disease without esophagitis: Secondary | ICD-10-CM | POA: Diagnosis not present

## 2023-09-29 ENCOUNTER — Telehealth: Payer: Self-pay | Admitting: *Deleted

## 2023-09-29 NOTE — Telephone Encounter (Signed)
-----   Message from [place] H sent at 09/28/2023  8:54 AM EDT ----- This patient canceled her US  today she says she will get it down at her GI doctor she is seeing today @ 1015. She can't be at her 10 US  here and then her GI doctor @ 1015.  Thanks

## 2023-09-30 NOTE — Telephone Encounter (Signed)
 Call to patient. No answer, voicemail not set up.   PUS for pelvic pain, ordered 09/27/23

## 2023-10-07 NOTE — Telephone Encounter (Signed)
 Patient has not rescheduled to date.   Patient is scheduled for AEX 11/09/23  Per review of EPIC, seen by Dr. Kristie on 09/28/23

## 2023-10-26 NOTE — Telephone Encounter (Signed)
 Ruth Kennedy -please advise if anything additional is needed.

## 2023-10-26 NOTE — Telephone Encounter (Signed)
 May cancel u/s if she has seen GI

## 2023-10-28 DIAGNOSIS — E6609 Other obesity due to excess calories: Secondary | ICD-10-CM | POA: Diagnosis not present

## 2023-10-28 DIAGNOSIS — Z01419 Encounter for gynecological examination (general) (routine) without abnormal findings: Secondary | ICD-10-CM | POA: Diagnosis not present

## 2023-10-28 DIAGNOSIS — Z6835 Body mass index (BMI) 35.0-35.9, adult: Secondary | ICD-10-CM | POA: Diagnosis not present

## 2023-10-28 DIAGNOSIS — Z7251 High risk heterosexual behavior: Secondary | ICD-10-CM | POA: Diagnosis not present

## 2023-10-28 DIAGNOSIS — R102 Pelvic and perineal pain: Secondary | ICD-10-CM | POA: Diagnosis not present

## 2023-10-28 DIAGNOSIS — R030 Elevated blood-pressure reading, without diagnosis of hypertension: Secondary | ICD-10-CM | POA: Diagnosis not present

## 2023-11-09 ENCOUNTER — Ambulatory Visit: Admitting: Radiology

## 2023-12-30 DIAGNOSIS — E669 Obesity, unspecified: Secondary | ICD-10-CM | POA: Diagnosis not present

## 2024-01-15 DIAGNOSIS — E669 Obesity, unspecified: Secondary | ICD-10-CM | POA: Diagnosis not present

## 2024-02-05 DIAGNOSIS — E669 Obesity, unspecified: Secondary | ICD-10-CM | POA: Diagnosis not present

## 2024-03-02 ENCOUNTER — Encounter

## 2024-03-07 ENCOUNTER — Other Ambulatory Visit: Payer: Self-pay | Admitting: Nurse Practitioner

## 2024-03-07 ENCOUNTER — Inpatient Hospital Stay
Admission: RE | Admit: 2024-03-07 | Discharge: 2024-03-07 | Attending: Nurse Practitioner | Admitting: Nurse Practitioner

## 2024-03-07 DIAGNOSIS — R921 Mammographic calcification found on diagnostic imaging of breast: Secondary | ICD-10-CM

## 2024-03-07 DIAGNOSIS — R928 Other abnormal and inconclusive findings on diagnostic imaging of breast: Secondary | ICD-10-CM
